# Patient Record
Sex: Female | Born: 1995
Health system: Southern US, Community
[De-identification: ages and names within clinical notes are randomized; demographics above are authoritative.]

## PROBLEM LIST (undated history)

## (undated) DIAGNOSIS — M94262 Chondromalacia, left knee: Secondary | ICD-10-CM

## (undated) DIAGNOSIS — M25369 Other instability, unspecified knee: Secondary | ICD-10-CM

## (undated) DIAGNOSIS — S83519A Sprain of anterior cruciate ligament of unspecified knee, initial encounter: Secondary | ICD-10-CM

---
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
## 2015-03-23 DIAGNOSIS — M5431 Sciatica, right side: Secondary | ICD-10-CM | POA: Diagnosis not present

## 2015-03-23 DIAGNOSIS — M9903 Segmental and somatic dysfunction of lumbar region: Secondary | ICD-10-CM | POA: Diagnosis not present

## 2015-03-25 DIAGNOSIS — M9903 Segmental and somatic dysfunction of lumbar region: Secondary | ICD-10-CM | POA: Diagnosis not present

## 2015-03-25 DIAGNOSIS — M5431 Sciatica, right side: Secondary | ICD-10-CM | POA: Diagnosis not present

## 2015-03-26 DIAGNOSIS — M5431 Sciatica, right side: Secondary | ICD-10-CM | POA: Diagnosis not present

## 2015-03-26 DIAGNOSIS — M9903 Segmental and somatic dysfunction of lumbar region: Secondary | ICD-10-CM | POA: Diagnosis not present

## 2015-05-29 MED FILL — LARIN FE 1.5-30 TABLET: 1.5-30 | 84 days supply | Qty: 84 | Fill #1

## 2015-09-15 MED FILL — LARIN FE 1.5-30 TABLET: 1.5-30 | 84 days supply | Qty: 84 | Fill #2

## 2015-10-14 DIAGNOSIS — J020 Streptococcal pharyngitis: Secondary | ICD-10-CM | POA: Diagnosis not present

## 2015-10-14 DIAGNOSIS — J029 Acute pharyngitis, unspecified: Secondary | ICD-10-CM | POA: Diagnosis not present

## 2015-10-22 DIAGNOSIS — M5431 Sciatica, right side: Secondary | ICD-10-CM | POA: Diagnosis not present

## 2015-10-22 DIAGNOSIS — M9903 Segmental and somatic dysfunction of lumbar region: Secondary | ICD-10-CM | POA: Diagnosis not present

## 2016-02-18 DIAGNOSIS — M5441 Lumbago with sciatica, right side: Secondary | ICD-10-CM | POA: Diagnosis not present

## 2016-02-18 DIAGNOSIS — G8929 Other chronic pain: Secondary | ICD-10-CM | POA: Diagnosis not present

## 2016-02-18 DIAGNOSIS — F331 Major depressive disorder, recurrent, moderate: Secondary | ICD-10-CM | POA: Diagnosis not present

## 2016-02-18 DIAGNOSIS — Z23 Encounter for immunization: Secondary | ICD-10-CM | POA: Diagnosis not present

## 2016-02-18 MED FILL — BUPROPION HCL XL 150 MG TAB: 150 | 30 days supply | Qty: 60 | Fill #0

## 2016-03-19 DIAGNOSIS — F3342 Major depressive disorder, recurrent, in full remission: Secondary | ICD-10-CM | POA: Diagnosis not present

## 2016-04-01 ENCOUNTER — Telehealth: Payer: 59 | Admitting: Family

## 2016-04-01 DIAGNOSIS — R112 Nausea with vomiting, unspecified: Secondary | ICD-10-CM | POA: Diagnosis not present

## 2016-04-01 NOTE — Progress Notes (Signed)
Based on what you shared with me it looks like you have a serious condition that should be evaluated in a face to face office visit.  NOTE: Even if you have entered your credit card information for this eVisit, you will not be charged.   If you are having a true medical emergency please call 911.  If you need an urgent face to face visit, [facility] has four urgent care centers for your convenience.  If you need care fast and have a high deductible or no insurance consider:   [email or web address]  [phone]  [address], [address] [place], [place] [address] 8 am to 8 pm Monday-Friday 10 am to 4 pm Saturday-Sunday   The following sites will take your  insurance:    . [facility]  [phone] Get Driving Directions Find a Provider at this Location  [address] [place], [place] [address] . 10 am to 8 pm Monday-Friday . 12 pm to 8 pm Saturday-Sunday   . [facility]  [phone] Get Driving Directions Find a Provider at this Location  [address], [address] [place], [place] [address] . 8 am to 8 pm Monday-Friday . 9 am to 6 pm Saturday . 11 am to 6 pm Sunday   . [facility] [place]  [phone] Get Driving Directions  [address] [place], [place] [address] . 8 am to 8 pm Monday-Friday . 8 am to 4 pm Saturday-Sunday   . [facility] (walk-ins welcome, or call for a scheduled time)  [phone]  Get Driving Directions Find a Provider at this Location  [address] [place],  [address] . 8 am to 8:30 pm Monday-Thursday . 8 am to 6 pm Friday . 8 am to 4 pm Saturday-Sunday   Your e-visit answers were reviewed by a board certified advanced clinical practitioner to complete your personal care plan.  Thank you for using e-Visits. 

## 2016-06-06 ENCOUNTER — Encounter (HOSPITAL_COMMUNITY): Payer: Self-pay | Admitting: Emergency Medicine

## 2016-06-06 ENCOUNTER — Emergency Department (HOSPITAL_COMMUNITY)
Admission: EM | Admit: 2016-06-06 | Discharge: 2016-06-06 | Disposition: A | Discharge status: Home / Self Care | Payer: 59 | Attending: Emergency Medicine | Admitting: Emergency Medicine

## 2016-06-06 ENCOUNTER — Emergency Department (HOSPITAL_COMMUNITY): Payer: 59

## 2016-06-06 DIAGNOSIS — N201 Calculus of ureter: Secondary | ICD-10-CM | POA: Diagnosis not present

## 2016-06-06 DIAGNOSIS — R109 Unspecified abdominal pain: Secondary | ICD-10-CM | POA: Diagnosis not present

## 2016-06-06 DIAGNOSIS — R1110 Vomiting, unspecified: Secondary | ICD-10-CM | POA: Diagnosis not present

## 2016-06-06 DIAGNOSIS — R1031 Right lower quadrant pain: Secondary | ICD-10-CM | POA: Diagnosis present

## 2016-06-06 LAB — CBC
HCT: 40.2 % (ref 36.0–46.0)
Hemoglobin: 13.6 g/dL (ref 12.0–15.0)
MCH: 28.8 pg (ref 26.0–34.0)
MCHC: 33.8 g/dL (ref 30.0–36.0)
MCV: 85 fL (ref 78.0–100.0)
Platelets: 354 10*3/uL (ref 150–400)
RBC: 4.73 MIL/uL (ref 3.87–5.11)
RDW: 12.6 % (ref 11.5–15.5)
WBC: 17 10*3/uL — ABNORMAL HIGH (ref 4.0–10.5)

## 2016-06-06 LAB — COMPREHENSIVE METABOLIC PANEL
ALBUMIN: 4.5 g/dL (ref 3.5–5.0)
ALK PHOS: 59 U/L (ref 38–126)
ALT: 20 U/L (ref 14–54)
AST: 23 U/L (ref 15–41)
Anion gap: 7 (ref 5–15)
BUN: 11 mg/dL (ref 6–20)
CALCIUM: 9.8 mg/dL (ref 8.9–10.3)
CHLORIDE: 110 mmol/L (ref 101–111)
CO2: 23 mmol/L (ref 22–32)
Creatinine, Ser: 0.81 mg/dL (ref 0.44–1.00)
GFR calc non Af Amer: >60 mL/min (ref >60)
GLUCOSE: 145 mg/dL — AB (ref 65–99)
Potassium: 3.2 mmol/L — ABNORMAL LOW (ref 3.5–5.1)
SODIUM: 140 mmol/L (ref 135–145)
Total Bilirubin: 0.4 mg/dL (ref 0.3–1.2)
Total Protein: 8.4 g/dL — ABNORMAL HIGH (ref 6.5–8.1)

## 2016-06-06 LAB — URINALYSIS, ROUTINE W REFLEX MICROSCOPIC
BACTERIA UA: NONE SEEN
Bilirubin Urine: NEGATIVE
Glucose, UA: NEGATIVE mg/dL
Ketones, ur: NEGATIVE mg/dL
Leukocytes, UA: NEGATIVE
Nitrite: NEGATIVE
PH: 7 (ref 5.0–8.0)
Protein, ur: NEGATIVE mg/dL
Specific Gravity, Urine: 1.016 (ref 1.005–1.030)

## 2016-06-06 LAB — I-STAT BETA HCG BLOOD, ED (MC, WL, AP ONLY): I-stat hCG, quantitative: <5 m[IU]/mL (ref <5)

## 2016-06-06 LAB — LIPASE, BLOOD: LIPASE: 22 U/L (ref 11–51)

## 2016-06-06 MED ORDER — ONDANSETRON HCL 4 MG/2ML IJ SOLN
4.0000 mg | Freq: Once | INTRAMUSCULAR | Status: AC
  Administered 2016-06-06: 4 mg via INTRAVENOUS
  Filled 2016-06-06: qty 2

## 2016-06-06 MED ORDER — OXYCODONE-ACETAMINOPHEN 5-325 MG PO TABS: 1.0000 | ORAL_TABLET | ORAL | 0 refills | Status: DC | PRN

## 2016-06-06 MED ORDER — MORPHINE SULFATE (PF) 4 MG/ML IV SOLN
4.0000 mg | Freq: Once | INTRAVENOUS | Status: AC
  Administered 2016-06-06: 4 mg via INTRAVENOUS
  Filled 2016-06-06: qty 1

## 2016-06-06 MED ORDER — OXYCODONE-ACETAMINOPHEN 5-325 MG PO TABS
1.0000 | ORAL_TABLET | Freq: Once | ORAL | Status: AC
  Administered 2016-06-06: 1 via ORAL
  Filled 2016-06-06: qty 1

## 2016-06-06 MED ORDER — PROMETHAZINE HCL 25 MG/ML IJ SOLN
25.0000 mg | Freq: Once | INTRAMUSCULAR | Status: AC
  Administered 2016-06-06: 25 mg via INTRAVENOUS
  Filled 2016-06-06: qty 1

## 2016-06-06 MED ORDER — KETOROLAC TROMETHAMINE 15 MG/ML IJ SOLN
30.0000 mg | Freq: Once | INTRAMUSCULAR | Status: DC
  Filled 2016-06-06: qty 2

## 2016-06-06 MED ORDER — ONDANSETRON 4 MG PO TBDP: 4.0000 mg | ORAL_TABLET | Freq: Three times a day (TID) | ORAL | 0 refills | Status: DC | PRN

## 2016-06-06 MED ORDER — ONDANSETRON 4 MG PO TBDP
4.0000 mg | ORAL_TABLET | Freq: Once | ORAL | Status: AC | PRN
  Administered 2016-06-06: 4 mg via ORAL
  Filled 2016-06-06: qty 1

## 2016-06-06 MED ORDER — IOPAMIDOL (ISOVUE-300) INJECTION 61%
INTRAVENOUS | Status: AC
  Administered 2016-06-06: 100 mL
  Filled 2016-06-06: qty 100

## 2016-06-06 NOTE — Discharge Instructions (Signed)
Please read attached information. If you experience any new or worsening signs or symptoms please return to the emergency room for evaluation. Please follow-up with your primary care provider or specialist as discussed. Please use medication prescribed only as directed and discontinue taking if you have any concerning signs or symptoms.   °

## 2016-06-06 NOTE — ED Triage Notes (Signed)
Pt reports RLQ pain and emesis that began at 1045 this am. One episode diarrhea as well. LMP 3 weeks ago.

## 2016-06-06 NOTE — ED Provider Notes (Signed)
[facility]-EMERGENCY DEPT Provider Note   CSN: [id number] Arrival date & time: 06/06/16  1249  By signing my name below, I, Sonum Patel, attest that this documentation has been prepared under the direction and in the presence of [employer], PA-C. Electronically Signed: Leone Payor, Scribe. 06/06/16. 4:33 PM.  History   Chief Complaint Chief Complaint  Patient presents with  . Abdominal Pain   The history is provided by the patient. No language interpreter was used.     HPI Comments: Wendy Bridges is a 21 y.o. female who presents to the Emergency Department complaining of constant, unchanged RLQ abdominal pain that began 5.5 hours ago. She reports associated nausea and episodes of vomiting along with 1 episode of diarrhea streaked with bright red blood. She denies similar symptoms in the past. She has not tried any OTC medications for her symptoms. She denies fever, urinary symptoms. She was given Zofran while in the waiting room which provided some relief.   Past Medical History:  Diagnosis Date  . ACL tear 10/2012   left  . 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 Anterior cruciate ligament tear 11/20/2012    Class: Acute    Past Surgical History:  Procedure Laterality Date  . ANTERIOR CRUCIATE LIGAMENT REPAIR Left 11/20/2012   Procedure: left knee arthroscopy with anterior cruciate ligamnet reconstruction autograph partial lateral menisectomy;  Surgeon: Velna Ochs, MD;  Location: [facility];  Service: Orthopedics;  Laterality: Left;    OB History    No data available       Home Medications    Prior to Admission medications   Medication Sig Start Date End Date Taking? Authorizing Provider  clindamycin (CLEOCIN) 300 MG capsule Take 1 capsule (300 mg total) by mouth 3 (three) times daily. X 10 days 03/16/14   Ria Clock, PA    HYDROcodone-acetaminophen (NORCO) 5-325 MG per tablet Take 1 tablet by mouth every 6 (six) hours as needed for pain. 11/20/12   Lindwood Qua, PA-C  norethindrone-ethinyl estradiol (JUNEL FE,GILDESS FE,LOESTRIN FE) 1-20 MG-MCG tablet Take 1 tablet by mouth daily.    Historical Provider, MD  ondansetron (ZOFRAN ODT) 4 MG disintegrating tablet Take 1 tablet (4 mg total) by mouth every 8 (eight) hours as needed for nausea or vomiting. 06/06/16   Eyvonne Mechanic, PA-C  oxyCODONE-acetaminophen (PERCOCET/ROXICET) 5-325 MG tablet Take 1-2 tablets by mouth every 4 (four) hours as needed for severe pain. 06/06/16   Eyvonne Mechanic, PA-C    Family History Family History  Problem Relation Age of Onset  . Anesthesia problems Mother     post-op N/V  . Anesthesia problems Maternal Grandmother     post-op N/V  . Diabetes Paternal Grandmother     Social History Social History  Substance Use Topics  . Smoking status: Never Smoker  . Smokeless tobacco: Never Used  . Alcohol use No     Allergies   Patient has no known allergies.   Review of Systems Review of Systems  A complete 10 system review of systems was obtained and all systems are negative except as noted in the HPI and PMH.    Physical Exam Updated Vital Signs BP 127/80 (BP Location: Left Arm)   Pulse 83   Resp 20   Ht 5\' 7"  (1.702 m)   Wt 108 kg   LMP 05/16/2016  SpO2 98%   BMI 37.28 kg/m   Physical Exam  Constitutional: She is oriented to person, place, and time. She appears well-developed and well-nourished.  HENT:  Head: Normocephalic and atraumatic.  Cardiovascular: Normal rate.   Pulmonary/Chest: Effort normal.  Abdominal:  Right lower quadrant abdominal pain tenderness to palpation.  Positive Rovsing sign  Neurological: She is alert and oriented to person, place, and time.  Skin: Skin is warm and dry.  Psychiatric: She has a normal mood and affect.  Nursing note and vitals reviewed.    ED Treatments / Results   DIAGNOSTIC STUDIES: Oxygen Saturation is 99% on RA, norma by my interpretation.    COORDINATION OF CARE: 4:33 PM Discussed treatment plan with pt at bedside and pt agreed to plan.   Labs (all labs ordered are listed, but only abnormal results are displayed) Labs Reviewed  COMPREHENSIVE METABOLIC PANEL - Abnormal; Notable for the following:       Result Value   Potassium 3.2 (*)    Glucose, Bld 145 (*)    Total Protein 8.4 (*)    All other components within normal limits  CBC - Abnormal; Notable for the following:    WBC 17.0 (*)    All other components within normal limits  URINALYSIS, ROUTINE W REFLEX MICROSCOPIC - Abnormal; Notable for the following:    Hgb urine dipstick LARGE (*)    Squamous Epithelial / LPF 0-5 (*)    All other components within normal limits  LIPASE, BLOOD  I-STAT BETA HCG BLOOD, ED ([facility], [facility], [facility] ONLY)    EKG  EKG Interpretation None       Radiology Ct Abdomen Pelvis W Contrast  Result Date: 06/06/2016 CLINICAL DATA:  Right abdominal pain, nausea/vomiting EXAM: CT ABDOMEN AND PELVIS WITH CONTRAST TECHNIQUE: Multidetector CT imaging of the abdomen and pelvis was performed using the standard protocol following bolus administration of intravenous contrast. CONTRAST:  ISOVUE-300 IOPAMIDOL (ISOVUE-300) INJECTION 61% COMPARISON:  None. FINDINGS: Motion degraded images. Lower chest: 3 mm subpleural lingular nodule along the left fissure (series 6/ image 31), likely benign. 8 mm subpleural nodule at the left lung base (series 6/image 48), also likely benign given the patient's young age. Hepatobiliary: Liver is within normal limits. Gallbladder is unremarkable. No intrahepatic or extrahepatic ductal dilatation. Pancreas: Within normal limits. Spleen: Within normal limits. Adrenals/Urinary Tract: Adrenal glands are within normal limits. Kidneys are within normal limits.  No hydronephrosis. 2 mm distal right ureteral calculus at/just above the UVJ (coronal image  125). Bladder is within normal limits. Stomach/Bowel: Stomach is within normal limits. No evidence of bowel obstruction. Appendix is motion degraded but grossly unremarkable (series 2/ image 58). No evidence of acute appendicitis. Colon is decompressed. Vascular/Lymphatic: No evidence of abdominal aortic aneurysm. No suspicious abdominopelvic lymphadenopathy. Reproductive: Uterus is within normal limits. Bilateral ovaries are within normal limits. Other: No abdominopelvic ascites. Musculoskeletal: Visualized osseous structures are within normal limits. IMPRESSION: Motion degraded images. 2 mm distal right ureteral calculus at the UVJ.  No hydronephrosis. No evidence of bowel obstruction.  Normal appendix. 8 mm nodule at the lateral left lung base, likely benign given the patient's age. Dedicated follow-up imaging is not required per Fleischner Society guidelines in a patient less than 61 years old. Electronically Signed   By: Charline Bills M.D.   On: 06/06/2016 18:28    Procedures Angiocath insertion Date/Time: 06/06/2016 4:46 PM Performed by: Curlene Dolphin, Ivonne Freeburg Authorized by: Curlene Dolphin, Taniya Dasher  Consent: Verbal consent obtained. Consent given by: patient  Patient understanding: patient states understanding of the procedure being performed Patient consent: the patient's understanding of the procedure matches consent given Procedure consent: procedure consent matches procedure scheduled Relevant documents: relevant documents present and verified Test results: test results available and properly labeled Site marked: the operative site was marked Imaging studies: imaging studies available Required items: required blood products, implants, devices, and special equipment available Patient identity confirmed: verbally with patient  Sedation: Patient sedated: no Comments: 20-gauge Angiocath in the left [facility]    (including critical care time)    Medications Ordered in ED Medications  ondansetron  (ZOFRAN-ODT) disintegrating tablet 4 mg (4 mg Oral Given 06/06/16 1308)  morphine 4 MG/ML injection 4 mg (4 mg Intravenous Given 06/06/16 1652)  ondansetron (ZOFRAN) injection 4 mg (4 mg Intravenous Given 06/06/16 1651)  iopamidol (ISOVUE-300) 61 % injection (100 mLs  Contrast Given 06/06/16 1801)  promethazine (PHENERGAN) injection 25 mg (25 mg Intravenous Given 06/06/16 1843)  oxyCODONE-acetaminophen (PERCOCET/ROXICET) 5-325 MG per tablet 1 tablet (1 tablet Oral Given 06/06/16 1922)     Initial Impression / Assessment and Plan / ED Course  I have reviewed the triage vital signs and the nursing notes.  Pertinent labs & imaging results that were available during my care of the patient were reviewed by me and considered in my medical decision making (see chart for details).      Final Clinical Impressions(s) / ED Diagnoses   Final diagnoses:  Ureterolithiasis    Labs: I-STAT beta-hCG, lipase, CMP, CBC  Imaging: CT abdomen and pelvis  Consults:  Therapeutics: Zofran, morphine, normal saline  Discharge Meds: Percocet, Zofran  Assessment/Plan: 21 year old female presents today with right lower quadrant abdominal pain.  Patient was given morphine and Zofran which seemed to improve her symptoms.  Her CT scan shows a right distal ureteral calculus.  Associated hydronephrosis.  No other acute findings that would attribute to her symptoms.  Patient's laboratory analysis significant for WBC is 17 with no other significant abnormalities.  No urinary tract infection or other infectious etiology.  Patient did have a bowel movement with streaking of bright red blood.  This is likely unrelated to her presentation with no significant intra-abdominal pathology other than the stone.  Patient will be discharged home with pain medication, antinausea medication and urology follow-up.  Patient is instructed to return if she continues to endorse significant pain, has recurrence of bloody stool, or any other  concerning signs or symptoms.  Both the patient and her mother verbalized understanding and agreement to today's plan and had no further questions or concerns at the time discharge    New Prescriptions New Prescriptions   ONDANSETRON (ZOFRAN ODT) 4 MG DISINTEGRATING TABLET    Take 1 tablet (4 mg total) by mouth every 8 (eight) hours as needed for nausea or vomiting.   OXYCODONE-ACETAMINOPHEN (PERCOCET/ROXICET) 5-325 MG TABLET    Take 1-2 tablets by mouth every 4 (four) hours as needed for severe pain.   I personally performed the services described in this documentation, which was scribed in my presence. The recorded information has been reviewed and is accurate. I personally performed the services described in this documentation, which was scribed in my presence. The recorded information has been reviewed and is accurate.    Eyvonne MechanicJeffrey Keyli Duross, PA-C 06/06/16 1936    Doug SouSam Jacubowitz, MD 06/06/16 2325

## 2016-06-07 MED FILL — ONDANSETRON ODT 4 MG TABLET: 4 | 6 days supply | Qty: 20 | Fill #0

## 2016-06-07 MED FILL — OXYCODONE/APAP 5/325 MG TAB: 5-325 | 2 days supply | Qty: 15 | Fill #0

## 2016-08-12 DIAGNOSIS — R21 Rash and other nonspecific skin eruption: Secondary | ICD-10-CM | POA: Diagnosis not present

## 2016-08-12 DIAGNOSIS — L989 Disorder of the skin and subcutaneous tissue, unspecified: Secondary | ICD-10-CM | POA: Diagnosis not present

## 2016-08-12 MED FILL — MUPIROCIN 2% OINTMENT: 2 | 10 days supply | Qty: 22 | Fill #0

## 2016-08-12 MED FILL — CEPHALEXIN 500 MG CAPSULE: 500 | 10 days supply | Qty: 30 | Fill #0

## 2017-07-23 ENCOUNTER — Emergency Department: Admission: EM | Admit: 2017-07-23 | Discharge: 2017-07-23 | Discharge status: Against Medical Advice | Payer: 59

## 2017-07-23 NOTE — ED Notes (Signed)
Pt called to triage without answer. Checked in restroom and outside,  Not found.

## 2017-07-23 NOTE — ED Notes (Signed)
Pt called to triage without answer.  

## 2017-07-23 NOTE — ED Notes (Signed)
Pt called for triage without answer 

## 2017-11-09 DIAGNOSIS — G43A0 Cyclical vomiting, not intractable: Secondary | ICD-10-CM | POA: Diagnosis not present

## 2017-11-09 MED FILL — PANTOPRAZOLE SOD DR 40 MG T: 40 | 30 days supply | Qty: 30 | Fill #0

## 2017-11-09 MED FILL — ONDANSETRON ODT 8 MG TABLET: 8 | 10 days supply | Qty: 20 | Fill #0

## 2017-11-10 ENCOUNTER — Emergency Department (HOSPITAL_BASED_OUTPATIENT_CLINIC_OR_DEPARTMENT_OTHER)
Admission: EM | Admit: 2017-11-10 | Discharge: 2017-11-10 | Disposition: A | Discharge status: Home / Self Care | Payer: 59 | Attending: Emergency Medicine | Admitting: Emergency Medicine

## 2017-11-10 ENCOUNTER — Other Ambulatory Visit: Payer: Self-pay

## 2017-11-10 ENCOUNTER — Encounter (HOSPITAL_BASED_OUTPATIENT_CLINIC_OR_DEPARTMENT_OTHER): Payer: Self-pay | Admitting: *Deleted

## 2017-11-10 DIAGNOSIS — G43A0 Cyclical vomiting, not intractable: Secondary | ICD-10-CM | POA: Diagnosis not present

## 2017-11-10 DIAGNOSIS — Z79899 Other long term (current) drug therapy: Secondary | ICD-10-CM | POA: Insufficient documentation

## 2017-11-10 DIAGNOSIS — R1115 Cyclical vomiting syndrome unrelated to migraine: Secondary | ICD-10-CM

## 2017-11-10 DIAGNOSIS — G43A1 Cyclical vomiting, intractable: Secondary | ICD-10-CM | POA: Diagnosis not present

## 2017-11-10 DIAGNOSIS — E875 Hyperkalemia: Secondary | ICD-10-CM | POA: Diagnosis not present

## 2017-11-10 DIAGNOSIS — F1290 Cannabis use, unspecified, uncomplicated: Secondary | ICD-10-CM | POA: Insufficient documentation

## 2017-11-10 DIAGNOSIS — R1110 Vomiting, unspecified: Secondary | ICD-10-CM | POA: Diagnosis present

## 2017-11-10 LAB — CBC
HEMATOCRIT: 40.7 % (ref 36.0–46.0)
HEMOGLOBIN: 14.1 g/dL (ref 12.0–15.0)
MCH: 29.9 pg (ref 26.0–34.0)
MCHC: 34.6 g/dL (ref 30.0–36.0)
MCV: 86.2 fL (ref 78.0–100.0)
Platelets: 324 10*3/uL (ref 150–400)
RBC: 4.72 MIL/uL (ref 3.87–5.11)
RDW: 12.6 % (ref 11.5–15.5)
WBC: 16.7 10*3/uL — ABNORMAL HIGH (ref 4.0–10.5)

## 2017-11-10 LAB — URINALYSIS, ROUTINE W REFLEX MICROSCOPIC
BILIRUBIN URINE: NEGATIVE
Glucose, UA: NEGATIVE mg/dL
HGB URINE DIPSTICK: NEGATIVE
Ketones, ur: 15 mg/dL — AB
Leukocytes, UA: NEGATIVE
Nitrite: NEGATIVE
PH: 8.5 — AB (ref 5.0–8.0)
Protein, ur: NEGATIVE mg/dL
SPECIFIC GRAVITY, URINE: 1.015 (ref 1.005–1.030)

## 2017-11-10 LAB — COMPREHENSIVE METABOLIC PANEL
ALBUMIN: 4.7 g/dL (ref 3.5–5.0)
ALK PHOS: 57 U/L (ref 38–126)
ALT: 23 U/L (ref 0–44)
ANION GAP: 13 (ref 5–15)
AST: 33 U/L (ref 15–41)
BUN: 7 mg/dL (ref 6–20)
CO2: 21 mmol/L — ABNORMAL LOW (ref 22–32)
Calcium: 9.4 mg/dL (ref 8.9–10.3)
Chloride: 105 mmol/L (ref 98–111)
Creatinine, Ser: 0.76 mg/dL (ref 0.44–1.00)
GFR calc non Af Amer: >60 mL/min (ref >60)
GLUCOSE: 135 mg/dL — AB (ref 70–99)
Potassium: 3.4 mmol/L — ABNORMAL LOW (ref 3.5–5.1)
Sodium: 139 mmol/L (ref 135–145)
Total Bilirubin: 0.6 mg/dL (ref 0.3–1.2)
Total Protein: 8.5 g/dL — ABNORMAL HIGH (ref 6.5–8.1)

## 2017-11-10 LAB — LIPASE, BLOOD: LIPASE: 30 U/L (ref 11–51)

## 2017-11-10 LAB — RAPID URINE DRUG SCREEN, HOSP PERFORMED
AMPHETAMINES: NOT DETECTED
BENZODIAZEPINES: NOT DETECTED
Barbiturates: NOT DETECTED
Cocaine: NOT DETECTED
Opiates: NOT DETECTED
TETRAHYDROCANNABINOL: POSITIVE — AB

## 2017-11-10 LAB — PREGNANCY, URINE: PREG TEST UR: NEGATIVE

## 2017-11-10 MED ORDER — HALOPERIDOL LACTATE 5 MG/ML IJ SOLN
2.0000 mg | Freq: Once | INTRAMUSCULAR | Status: AC
  Administered 2017-11-10: 2 mg via INTRAVENOUS
  Filled 2017-11-10: qty 1

## 2017-11-10 MED ORDER — PROMETHAZINE HCL 25 MG PO TABS: 25.0000 mg | ORAL_TABLET | Freq: Four times a day (QID) | ORAL | 0 refills | Status: DC | PRN

## 2017-11-10 MED ORDER — ONDANSETRON HCL 4 MG/2ML IJ SOLN: 4.0000 mg | Freq: Once | INTRAMUSCULAR | Status: DC

## 2017-11-10 MED ORDER — SODIUM CHLORIDE 0.9 % IV BOLUS
1000.0000 mL | Freq: Once | INTRAVENOUS | Status: AC
  Administered 2017-11-10: 1000 mL via INTRAVENOUS

## 2017-11-10 MED ORDER — ONDANSETRON HCL 4 MG/2ML IJ SOLN
4.0000 mg | Freq: Once | INTRAMUSCULAR | Status: AC
  Administered 2017-11-10: 4 mg via INTRAVENOUS

## 2017-11-10 MED ORDER — ONDANSETRON HCL 4 MG/2ML IJ SOLN
INTRAMUSCULAR | Status: AC
  Filled 2017-11-10: qty 2

## 2017-11-10 NOTE — Discharge Instructions (Addendum)
Please avoid any use of marijuana and read the information below carefully.  Continue frequent small sips (10-20 ml) of clear liquids every 5-10 minutes. Gatorade or powerade are good options. Avoid milk, orange juice, and grape juice for now. . Once you have not had further vomiting with the small sips for 4 hours, you may begin to drink larger volumes of fluids at a time and try a bland diet which may include saltine crackers, applesauce, breads, pastas, bananas, bland chicken. If you continues to vomit despite medication, return to the ED for repeat evaluation. Otherwise, follow up with your doctor in 2-3 days for a re-check.  What is cannabinoid hyperemesis syndrome? Cannabinoid hyperemesis syndrome (CHS) is a condition that leads to repeated and severe bouts of vomiting. It occurs in long-term users of marijuana (cannabis). Usually marijuana and other cannabinoids decrease nausea and vomiting, but in some high frequency long-term users, marijuana has the opposite (paradoxical)effect.  Marijuana is the dried leaves, flowers, stems, and seeds from the Cannabis sativa plant. Hashish is made from the concentrated resins of the Cannabis satvia's female flower. Marijuana has several active substances. These include tetrahydrocannabinol (THC) and related chemicals. These bind to molecules found in the brain. That causes the drug high and other effects that users feel.  Your digestive tract also has a number of molecules that bind to [facility] and related substances. So marijuana also affects the digestive tract. For example, the drug can alter the time it takes the stomach to empty. It also affects the esophageal sphincter. Thats the tight band of muscle that opens and closes to let food from the esophagus into the stomach. Long-term marijuana use can change the way the affected molecules respond and lead to the symptoms of CHS.  Marijuana is the most widely used illegal drug in the [place] Young adults are the  most frequent users. A small portion of these people develop CHS. It usually only happens in people who have regularly used marijuana for several years. Often CHS affects those who use the drug at least once a day.  What causes cannabinoid hyperemesis syndrome? Marijuana has very complex effects on the body. Experts are still trying to learn exactly how it causes CHS in some people.  In the brain, marijuana often has the opposite effect of CHS. It helps prevent nausea and vomiting. The drug is also good at stopping such symptoms in people having chemotherapy.  In the digestive tract, marijuana seems to have the opposite effect. It actually makes you more likely to have nausea and vomiting. With first use, the signals from the brain may be more important. That may lead to anti-nausea effects at first. But with repeated use of marijuana, certain receptors in the brain may stop responding to the drug in the same way. That may cause the repeated bouts of vomiting found in people with CHS.  It still isnt clear why some marijuana users get the syndrome. But others do not.  What are the symptoms of cannabinoid hyperemesis syndrome? People with CHS suffer from repeated bouts of vomiting. In between these episodes are times without any symptoms.  There may be months to years in between bouts of hyperemesis (severe vomiting).  The hyperemetic phase is next. Symptoms during this time may include:  Ongoing nausea Repeated episodes of vomiting +/-Abdominal pain Decreased food intake and weight loss Symptoms of dehydration During this phase, vomiting is often intense and overwhelming. Many people take a lot of hot showers during the day. They find  that doing so eases their nausea. (That may be because of the effects of the hot temperature on a part of the brain called the hypothalamus. It has effects on both temperature regulation and vomiting.) People often first seek medical care during this phase.  The  hyperemetic phase may continue until the person completely stops using marijuana. Then the recovery phrase starts. During this time, symptoms go away. Normal eating is possible again. This phase can last days to months. Symptoms usually come back if the person tries marijuana again.  How is cannabinoid hyperemesis syndrome diagnosed? Many health problems can cause repeated vomiting. To make a diagnosis, your healthcare provider will ask you about your symptoms and your past health. He or she will also do a physical exam, including an exam of your abdomen.  Your healthcare provider may also need more tests to rule out other causes of the vomiting. Thats especially the case for ones that may signal a health emergency. Based on your other symptoms, these tests might include:  Blood tests for anemia and infection Tests for electrolytes Tests for pancreas and liver enzymes, to check these organs Pregnancy test Urine analysis, to test for infection or other urinary causes Drug screen, to test for drug-related causes of vomiting X-rays of the abdomen, to check for conditions like blockage Upper endoscopy, to view the stomach and esophagus for possible causes of vomiting Head CT scan, if a nervous system cause of vomiting seems likely Abdominal CT scan, to check for health problems that might need surgery CHS was only recently discovered. So some healthcare providers may not know about it. As a result, they may fail to spot it for many years. They often confuse CHS with cyclical vomiting disorder. Its a health problem that causes similar symptoms. A gastroenterologist might make the diagnosis.  How is cannabinoid hyperemesis syndrome treated? If you have had severe vomiting, you might need to stay in the hospital for a short time. During the hyperemesis phase, you might need these treatments:  Fluid replacement for dehydration, given through an IV Medicines to help decrease vomiting Pain  medicine Proton-pump inhibitors, to treat stomach inflammation Frequent hot showers Symptoms often ease after a day or two unless marijuana is used before this time.  To fully recover, you need to stop using marijuana all together. Some people may get help from drug rehabilitation programs to help them quit. Cognitive behavioral therapy or family therapy can also help. If you stop using marijuana, your symptoms should not come back.  What are the complications of cannabinoid hyperemesis syndrome? Very severe, prolonged vomiting may lead to dehydration. It may also lead to electrolyte problems in your blood. If untreated, these can cause rare complications such as:  Brain swelling (cerebral edema) Muscle spasms or weakness Seizures Kidney failure Heart rhythm abnormalities Shock Your healthcare team will quickly work to fix any dehydration or electrolyte problems. Doing so can help prevent these problems.  What can I do to prevent cannabinoid hyperemesis syndrome? You can prevent CHS by not using marijuana in any form. You may be reluctant to believe that marijuana might be the underlying cause of your symptoms. That may be because you have used it for many years without having any problems. The syndrome may take several years to develop. The drug may help prevent nausea in new users who dont use it often. But people with CHS need to completely quit using it. If they dont, their symptoms will likely come back.  Quitting marijuana may  lead to other health benefits, like:  Better lung function Improved memory and thinking skills Better sleep Decreased risk for depression and anxiety When should I call my healthcare provider? Call your healthcare provider if you have had severe vomiting for a day or more. Key points about cannabinoid hyperemesis syndrome CHS is a condition that leads to repeated and severe bouts of vomiting. It results from long-term use of marijuana. Most people  self-treat using hot showers to help reduce their symptoms. Some people with the syndrome may not be diagnosed for several years. Admitting to your healthcare provider that you use marijuana daily can significantly speed up the diagnosis and possibly avoid many invasive and expensive tests. You might need to stay in the hospital to treat dehydration from [facility]. Symptoms start to go away within a day or two after stopping marijuana use. In people with CHS, symptoms almost always come back if they use the drug again. Next steps Tips to help you get the most from a visit to your healthcare provider:  Know the reason for your visit and what you want to happen. Before your visit, write down questions you want answered. Bring someone with you to help you ask questions and remember what your provider tells you. At the visit, write down the name of a new diagnosis, and any new medicines, treatments, or tests. Also write down any new instructions your provider gives you. Know why a new medicine or treatment is prescribed, and how it will help you. Also know what the side effects are. Ask if your condition can be treated in other ways. Know why a test or procedure is recommended and what the results could mean. Know what to expect if you do not take the medicine or have the test or procedure. If you have a follow-up appointment, write down the date, time, and purpose for that visit. Know how you can contact your provider if you have questions.

## 2017-11-10 NOTE — ED Notes (Signed)
Pt unable to void at this time. 

## 2017-11-10 NOTE — ED Provider Notes (Signed)
[facility] EMERGENCY DEPARTMENT Provider Note   CSN: [id number] Arrival date & time: 11/10/17  1429     History   Chief Complaint Chief Complaint  Patient presents with  . Emesis    HPI Wendy Bridges is a 22 y.o. female.  The emergency department with chief complaint of intractable vomiting.  The patient has had intractable vomiting for the past 4 days.  She states that every day she wakes up with vomiting that lasts at least until noon.  She was seen by her PCP yesterday and had lab work done was given Zofran and acid reducing medication.  She states that she has been vomiting so much she could never keep the dissolving tablet in her mouth long enough to have at work and they called her PCP today who directed her to the emergency department.  The patient states that she does not currently smoke marijuana and has been several years.  She has had one episode like this in the past back in March and both of these episodes have been associated with menstruation.  Patient states that she has very irregular periods she has not had been worked up in the past for this.  She denies fevers, chills, abdominal pain except for abdominal muscle wall pain when she retches.  She denies constipation or diarrhea. She states that she finds relief by taking warm showers.  HPI  Past Medical History:  Diagnosis Date  . ACL tear 10/2012   left  . 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 Anterior cruciate ligament tear 11/20/2012    Class: Acute    Past Surgical History:  Procedure Laterality Date  . ANTERIOR CRUCIATE LIGAMENT REPAIR Left 11/20/2012   Procedure: left knee arthroscopy with anterior cruciate ligamnet reconstruction autograph partial lateral menisectomy;  Surgeon: Velna Ochs, MD;  Location: [facility];  Service: Orthopedics;  Laterality: Left;     OB History     None      Home Medications    Prior to Admission medications   Medication Sig Start Date End Date Taking? Authorizing Provider  clindamycin (CLEOCIN) 300 MG capsule Take 1 capsule (300 mg total) by mouth 3 (three) times daily. X 10 days 03/16/14   Ria Clock, PA  HYDROcodone-acetaminophen (NORCO) 5-325 MG per tablet Take 1 tablet by mouth every 6 (six) hours as needed for pain. 11/20/12   Lindwood Qua, PA-C  norethindrone-ethinyl estradiol (JUNEL FE,GILDESS FE,LOESTRIN FE) 1-20 MG-MCG tablet Take 1 tablet by mouth daily.    [provider]  ondansetron (ZOFRAN ODT) 4 MG disintegrating tablet Take 1 tablet (4 mg total) by mouth every 8 (eight) hours as needed for nausea or vomiting. 06/06/16   Hedges, Tinnie Gens, PA-C  oxyCODONE-acetaminophen (PERCOCET/ROXICET) 5-325 MG tablet Take 1-2 tablets by mouth every 4 (four) hours as needed for severe pain. 06/06/16   Eyvonne Mechanic, PA-C    Family History Family History  Problem Relation Age of Onset  . Anesthesia problems Mother        post-op N/V  . Anesthesia problems Maternal Grandmother        post-op N/V  . Diabetes Paternal Grandmother     Social History Social History   Tobacco Use  . Smoking status: Never Smoker  . Smokeless tobacco: Never Used  Substance Use Topics  . Alcohol use: No  .  Drug use: No     Allergies   Patient has no known allergies.   Review of Systems Review of Systems  Ten systems reviewed and are negative for acute change, except as noted in the HPI.   Physical Exam Updated Vital Signs BP 131/80   Pulse (!) 54   Resp 16   Ht 5\' 7"  (1.702 m)   Wt 121.1 kg   LMP 11/05/2017   SpO2 100%   BMI 41.82 kg/m   Physical Exam  Constitutional: She is oriented to person, place, and time. She appears well-developed.  Peaked, ill-appearing female  HENT:  Head: Normocephalic and atraumatic.  Eyes: Pupils are equal, round, and reactive to light. EOM are normal.  Neck: Normal  range of motion. Neck supple.  Cardiovascular: Normal rate and regular rhythm.  Pulmonary/Chest: Effort normal and breath sounds normal.  Abdominal: Soft. Bowel sounds are normal. She exhibits no distension. There is no tenderness.  Musculoskeletal: She exhibits no edema or tenderness.  Neurological: She is alert and oriented to person, place, and time.  Skin: Skin is warm and dry.  Psychiatric: She has a normal mood and affect.  Nursing note and vitals reviewed.    ED Treatments / Results  Labs (all labs ordered are listed, but only abnormal results are displayed) Labs Reviewed  COMPREHENSIVE METABOLIC PANEL - Abnormal; Notable for the following components:      Result Value   Potassium 3.4 (*)    CO2 21 (*)    Glucose, Bld 135 (*)    Total Protein 8.5 (*)    All other components within normal limits  CBC - Abnormal; Notable for the following components:   WBC 16.7 (*)    All other components within normal limits  LIPASE, BLOOD  URINALYSIS, ROUTINE W REFLEX MICROSCOPIC  PREGNANCY, URINE  RAPID URINE DRUG SCREEN, HOSP PERFORMED    EKG None  Radiology No results found.  Procedures Procedures (including critical care time)  Medications Ordered in ED Medications  sodium chloride 0.9 % bolus 1,000 mL (1,000 mLs Intravenous New Bag/Given 11/10/17 1512)  ondansetron (ZOFRAN) injection 4 mg (4 mg Intravenous Given 11/10/17 1515)  haloperidol lactate (HALDOL) injection 2 mg (2 mg Intravenous Given 11/10/17 1613)     Initial Impression / Assessment and Plan / ED Course  I have reviewed the triage vital signs and the nursing notes.  Pertinent labs & imaging results that were available during my care of the patient were reviewed by me and considered in my medical decision making (see chart for details).  Clinical Course as of Nov 10 2008  Fri Nov 10, 2017  1633 This is a 22 year old female with intractable vomiting. The emergent differential diagnosis for vomiting includes,  but is not limited to ACS/MI, Boerhaave's, DKA, Intracranial Hemorrhage, Ischemic bowel, Meningitis, Sepsis, Acute radiation syndrome, Acute gastric dilation, Acetaminophen toxicity, Adrenal insufficiency, Appendicitis, Aspirin toxicity, Bowel obstruction/ileus, Carbon monoxide poisoning, Cholecystitis, CNS tumor. Digoxin toxicity, Electrolyte abnormalities, Elevated ICP, Gastric outlet obstruction, Hyperemesis gravidarum, Pancreatitis, Peritonitis, Ruptured viscus, Testicular torsion/ovarian torsion, Theophyline toxicity, Biliary colic, Cannabinoid hyperemesis syndrome, Chemotherapy, Disulfiram effect, Erythromycin, ETOH, Gastritis, Gastroenteritis, Gastroparesis, Hepatitis, Ibuprofen, Ipecac toxicity, Labyrinthitis, Migraine, Motion sickness, Narcotic withdrawal, Thyroid, Pregnancy, Peptic ulcer disease, Renal colic, and UTI    [AH]  1645 Her  labs show mild hyperkalemia, elevated blood glucose and elevated white blood cell count which I suspect is likely acute phase reaction given her benign abdominal exam.  Patient's pulse rate is low and I also suspect  that she is vagal laying given her protracted vomiting.   [AH]  1712 Pulse Rate(!): 46 [AH]    Clinical Course User Index [AH] Arthor Captain, PA-C    Patient here with vomiting.  Given Zofran and and Haldol which resolved her nausea and vomiting.  I reviewed her EKG which normal sinus rhythm and no evidence of QT prolongation.  The patient denies marijuana abuse however her urine drug screen is positive for THC.  I did discuss the fact that her symptoms could very likely be related to cannabis hyperemesis also given her history of liking to take warm showers which seems to give her some relief.  The patient has the potential for other issues given the association with her menstrual cycle and should certainly follow-up with OB/GYN for further evaluation of potential endometriosis or PCOS.  Patient is able to tolerate fluids at this time.  She will  be discharged with Phenergan tablets.  She appears appropriate for discharge and I discussed return precautions with the patient and her family who is at bedside.  Final Clinical Impressions(s) / ED Diagnoses   Final diagnoses:  None    ED Discharge Orders    None       Arthor Captain, PA-C 11/10/17 2012    Gwyneth Sprout, MD 11/10/17 2306

## 2017-11-10 NOTE — ED Notes (Signed)
Sprite given for PO challenge. 

## 2017-11-10 NOTE — ED Triage Notes (Signed)
Vomiting every am for a week. Today she has been vomiting all day. She has zofran and antiacid with no relief.

## 2017-11-21 ENCOUNTER — Other Ambulatory Visit: Payer: Self-pay | Admitting: Family Medicine

## 2017-11-21 DIAGNOSIS — R1032 Left lower quadrant pain: Secondary | ICD-10-CM

## 2017-11-21 DIAGNOSIS — R112 Nausea with vomiting, unspecified: Secondary | ICD-10-CM

## 2017-11-21 DIAGNOSIS — R3129 Other microscopic hematuria: Secondary | ICD-10-CM

## 2017-11-24 ENCOUNTER — Ambulatory Visit
Admission: RE | Admit: 2017-11-24 | Discharge: 2017-11-24 | Disposition: A | Discharge status: Home / Self Care | Payer: 59 | Source: Ambulatory Visit | Attending: Family Medicine | Admitting: Family Medicine

## 2017-11-24 DIAGNOSIS — R112 Nausea with vomiting, unspecified: Secondary | ICD-10-CM

## 2017-11-24 DIAGNOSIS — R3129 Other microscopic hematuria: Secondary | ICD-10-CM

## 2017-11-24 DIAGNOSIS — R102 Pelvic and perineal pain: Secondary | ICD-10-CM | POA: Diagnosis not present

## 2017-11-24 DIAGNOSIS — R1032 Left lower quadrant pain: Secondary | ICD-10-CM

## 2018-01-01 DIAGNOSIS — N915 Oligomenorrhea, unspecified: Secondary | ICD-10-CM | POA: Diagnosis not present

## 2018-01-01 DIAGNOSIS — Z0000 Encounter for general adult medical examination without abnormal findings: Secondary | ICD-10-CM | POA: Diagnosis not present

## 2018-01-01 DIAGNOSIS — Z113 Encounter for screening for infections with a predominantly sexual mode of transmission: Secondary | ICD-10-CM | POA: Diagnosis not present

## 2018-01-01 DIAGNOSIS — L680 Hirsutism: Secondary | ICD-10-CM | POA: Diagnosis not present

## 2018-01-02 MED FILL — FEMYNOR 0.25-35 MG-MCG TABS: 0.25-35 | 28 days supply | Qty: 28 | Fill #0

## 2018-01-09 DIAGNOSIS — Z23 Encounter for immunization: Secondary | ICD-10-CM | POA: Diagnosis not present

## 2018-01-09 DIAGNOSIS — F331 Major depressive disorder, recurrent, moderate: Secondary | ICD-10-CM | POA: Diagnosis not present

## 2018-01-10 MED FILL — clonazePAM 0.5 MG TABS: 0.5 | 15 days supply | Qty: 15 | Fill #0

## 2018-01-10 MED FILL — ESCITALOPRAM 10 MG TABLET: 10 | 30 days supply | Qty: 30 | Fill #0

## 2018-01-24 DIAGNOSIS — R1115 Cyclical vomiting syndrome unrelated to migraine: Secondary | ICD-10-CM | POA: Diagnosis not present

## 2018-01-24 DIAGNOSIS — R110 Nausea: Secondary | ICD-10-CM | POA: Diagnosis not present

## 2018-01-25 ENCOUNTER — Other Ambulatory Visit: Payer: Self-pay | Admitting: Gastroenterology

## 2018-01-25 DIAGNOSIS — R112 Nausea with vomiting, unspecified: Secondary | ICD-10-CM

## 2018-01-25 DIAGNOSIS — R194 Change in bowel habit: Secondary | ICD-10-CM | POA: Diagnosis not present

## 2018-01-25 DIAGNOSIS — K219 Gastro-esophageal reflux disease without esophagitis: Secondary | ICD-10-CM | POA: Diagnosis not present

## 2018-01-25 MED FILL — SUMAtriptan 5 MG/ACT SOLN: 5 | 30 days supply | Qty: 6 | Fill #0

## 2018-01-26 ENCOUNTER — Ambulatory Visit (HOSPITAL_COMMUNITY)
Admission: RE | Admit: 2018-01-26 | Discharge: 2018-01-26 | Disposition: A | Discharge status: Home / Self Care | Payer: 59 | Source: Ambulatory Visit | Attending: Gastroenterology | Admitting: Gastroenterology

## 2018-01-26 DIAGNOSIS — R112 Nausea with vomiting, unspecified: Secondary | ICD-10-CM | POA: Insufficient documentation

## 2018-02-02 MED FILL — FEMYNOR 0.25-35 MG-MCG TABS: 0.25-35 | 28 days supply | Qty: 28 | Fill #1

## 2018-02-05 ENCOUNTER — Other Ambulatory Visit (HOSPITAL_COMMUNITY): Payer: 59

## 2018-02-05 ENCOUNTER — Encounter (HOSPITAL_COMMUNITY)
Admission: RE | Admit: 2018-02-05 | Discharge: 2018-02-05 | Disposition: A | Discharge status: Home / Self Care | Payer: 59 | Source: Ambulatory Visit | Attending: Gastroenterology | Admitting: Gastroenterology

## 2018-02-05 DIAGNOSIS — R112 Nausea with vomiting, unspecified: Secondary | ICD-10-CM | POA: Diagnosis not present

## 2018-02-05 MED ORDER — TECHNETIUM TC 99M MEBROFENIN IV KIT
5.0000 | PACK | Freq: Once | INTRAVENOUS | Status: AC | PRN
  Administered 2018-02-05: 5 via INTRAVENOUS

## 2018-02-20 ENCOUNTER — Other Ambulatory Visit: Payer: Self-pay | Admitting: Surgery

## 2018-02-20 DIAGNOSIS — R1110 Vomiting, unspecified: Secondary | ICD-10-CM | POA: Diagnosis not present

## 2018-03-07 DIAGNOSIS — R112 Nausea with vomiting, unspecified: Secondary | ICD-10-CM | POA: Diagnosis not present

## 2018-03-08 DIAGNOSIS — R112 Nausea with vomiting, unspecified: Secondary | ICD-10-CM | POA: Diagnosis not present

## 2018-03-08 DIAGNOSIS — K2930 Chronic superficial gastritis without bleeding: Secondary | ICD-10-CM | POA: Diagnosis not present

## 2018-03-08 MED FILL — FEMYNOR 0.25-35 MG-MCG TABS: 0.25-35 | 84 days supply | Qty: 84 | Fill #2

## 2018-04-02 DIAGNOSIS — Z3041 Encounter for surveillance of contraceptive pills: Secondary | ICD-10-CM | POA: Diagnosis not present

## 2018-04-02 DIAGNOSIS — E282 Polycystic ovarian syndrome: Secondary | ICD-10-CM | POA: Diagnosis not present

## 2018-04-08 ENCOUNTER — Telehealth: Payer: 59 | Admitting: Family

## 2018-04-08 DIAGNOSIS — J111 Influenza due to unidentified influenza virus with other respiratory manifestations: Secondary | ICD-10-CM

## 2018-04-08 MED ORDER — OSELTAMIVIR PHOSPHATE 75 MG PO CAPS: 75.0000 mg | ORAL_CAPSULE | Freq: Two times a day (BID) | ORAL | 0 refills | Status: DC

## 2018-04-08 NOTE — Progress Notes (Signed)
Thank you for the details you included in the comment boxes. Those details are very helpful in determining the best course of treatment for you and help us to provide the best care.  E visit for Flu like symptoms   We are sorry that you are not feeling well.  Here is how we plan to help! Based on what you have shared with me it looks like you may have possible exposure to a virus that causes influenza.  Influenza or "the flu" is   an infection caused by a respiratory virus. The flu virus is highly contagious and persons who did not receive their yearly flu vaccination may "catch" the flu from close contact.  We have anti-viral medications to treat the viruses that cause this infection. They are not a "cure" and only shorten the course of the infection. These prescriptions are most effective when they are given within the first 2 days of "flu" symptoms. Antiviral medication are indicated if you have a high risk of complications from the flu. You should  also consider an antiviral medication if you are in close contact with someone who is at risk. These medications can help patients avoid complications from the flu  but have side effects that you should know. Possible side effects from Tamiflu or oseltamivir include nausea, vomiting, diarrhea, dizziness, headaches, eye redness, sleep problems or other respiratory symptoms. You should not take Tamiflu if you have an allergy to oseltamivir or any to the ingredients in Tamiflu.  Based upon your symptoms and potential risk factors I have prescribed Oseltamivir (Tamiflu).  It has been sent to your designated pharmacy.  You will take one 75 mg capsule orally twice a day for the next 5 days.  ANYONE WHO HAS FLU SYMPTOMS SHOULD: . Stay home. The flu is highly contagious and going out or to work exposes others! . Be sure to drink plenty of fluids. Water is fine as well as fruit juices, sodas and electrolyte beverages. You may want to stay away from caffeine or  alcohol. If you are nauseated, try taking small sips of liquids. How do you know if you are getting enough fluid? Your urine should be a pale yellow or almost colorless. . Get rest. . Taking a steamy shower or using a humidifier may help nasal congestion and ease sore throat pain. Using a saline nasal spray works much the same way. . Cough drops, hard candies and sore throat lozenges may ease your cough. . Line up a caregiver. Have someone check on you regularly.   GET HELP RIGHT AWAY IF: . You cannot keep down liquids or your medications. . You become short of breath . Your fell like you are going to pass out or loose consciousness. . Your symptoms persist after you have completed your treatment plan MAKE SURE YOU   Understand these instructions.  Will watch your condition.  Will get help right away if you are not doing well or get worse.  Your e-visit answers were reviewed by a board certified advanced clinical practitioner to complete your personal care plan.  Depending on the condition, your plan could have included both over the counter or prescription medications.  If there is a problem please reply  once you have received a response from your provider.  Your safety is important to us.  If you have drug allergies check your prescription carefully.    You can use MyChart to ask questions about today's visit, request a non-urgent call back, or ask   for a work or school excuse for 24 hours related to this e-Visit. If it has been greater than 24 hours you will need to follow up with your provider, or enter a new e-Visit to address those concerns.  You will get an e-mail in the next two days asking about your experience.  I hope that your e-visit has been valuable and will speed your recovery. Thank you for using e-visits.   

## 2018-07-19 DIAGNOSIS — Z20828 Contact with and (suspected) exposure to other viral communicable diseases: Secondary | ICD-10-CM | POA: Diagnosis not present

## 2018-07-19 DIAGNOSIS — R05 Cough: Secondary | ICD-10-CM | POA: Diagnosis not present

## 2018-07-19 DIAGNOSIS — Z7189 Other specified counseling: Secondary | ICD-10-CM | POA: Diagnosis not present

## 2018-07-19 DIAGNOSIS — J069 Acute upper respiratory infection, unspecified: Secondary | ICD-10-CM | POA: Diagnosis not present

## 2018-10-15 ENCOUNTER — Ambulatory Visit (HOSPITAL_COMMUNITY)
Admission: RE | Admit: 2018-10-15 | Discharge: 2018-10-15 | Disposition: A | Discharge status: Home / Self Care | Payer: 59 | Attending: Psychiatry | Admitting: Psychiatry

## 2018-10-15 DIAGNOSIS — F411 Generalized anxiety disorder: Secondary | ICD-10-CM | POA: Insufficient documentation

## 2018-10-15 DIAGNOSIS — Z833 Family history of diabetes mellitus: Secondary | ICD-10-CM | POA: Diagnosis not present

## 2018-10-15 NOTE — BH Assessment (Signed)
Assessment Note  Wendy Bridges is a 23 y.o. [facility] to deal with her anxiety.  Pt states "I'm struggling today at work. I'm experiencing a whole lot of anxiety and some panic attacks.  I need some help with my emotions." Pt reports having a [facility] appointment [facility] for this coming Wednesday. Pt admits to substance use in the following "I smoke 1 blunt a week maybe within 3 days." Pt denies being SI/HI/A/V-hallucinations.     Pt was able to reliably contract for safety. Pt was receptive to receiving additional community outpatient resources to help with her anxiety.   Pt reside with her parents, brother and grandmother.  Pt works a Fish farm managerfull-time job.  Pt denies a history of inpatient/outpatient MH/SA treatment.  Pt denies a history of physical, sexual and verbal abuse.  Patient was wearing casual clothes and appeared appropriately groomed.  Pt was alert throughout the assessment.  Patient made fair eye contact and had normal psychomotor activity.  Patient spoke in a normal voice without pressured speech.  Pt expressed feeling anxious.  Pt's affect appeared dysphoric and congruent with stated mood. Pt's thought process was coherent and logical.  Pt presented with partial insight and judgement.  Pt did not appear to be responding to internal stimuli.  Pt was able to reliably contract for safety.   [facility] with BH Provider, Denzil MagnusonLaShunda Thomas, NP who recommended discharge with community mental health resources.  [facility] provided [facility] with community outpatient MH resources prior to pt's discharge.  Diagnosis: F41.1 Generalized Anxiety Disorder  Past Medical History:  Past Medical History:  Diagnosis Date  . ACL tear 10/2012   left  . 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 ANTERIOR CRUCIATE LIGAMENT REPAIR Left 11/20/2012    Procedure: left knee arthroscopy with anterior cruciate ligamnet reconstruction autograph partial lateral menisectomy;  Surgeon: Velna OchsPeter G Dalldorf, MD;  Location: [facility];  Service: Orthopedics;  Laterality: Left;    Family History:  Family History  Problem Relation Age of Onset  . Anesthesia problems Mother        post-op N/V  . Anesthesia problems Maternal Grandmother        post-op N/V  . Diabetes Paternal Grandmother     Social History:  reports that she has never smoked. She has never used smokeless tobacco. She reports current drug use. Drug: Marijuana. She reports that she does not drink alcohol.  Additional Social History:  Alcohol / Drug Use Pain Medications: See MARs Prescriptions: See MARs Over the Counter: See MARs History of alcohol / drug use?: Yes Substance #1 Name of Substance 1: Cannabis 1 - Age of First Use: unknown 1 - Amount (size/oz): 1 blunt 1 - Frequency: 3 times per week 1 - Duration: ongoing 1 - Last Use / Amount: few days ago  CIWA: CIWA-Ar BP: 126/75 Pulse Rate: 84 COWS:    Allergies: No Known Allergies  Home Medications: (Not in a hospital admission)   OB/GYN Status:  No LMP recorded.  General Assessment Data [facility] TTS Assessment: In system Is this a Tele or Face-to-Face Assessment?: Face-to-Face Is this an Initial Assessment or a Re-assessment for this encounter?: Initial Assessment Patient Accompanied by:: N/A Language Other than English: No Living Arrangements: Other (Comment)(Parents) What gender do you identify as?: Female Marital  status: Single Maiden name: Fitterer Pregnancy Status: Unknown Living Arrangements: Parent, Other relatives(Parents, brother, grandmother) Can pt return to current living arrangement?: Yes Admission Status: Voluntary Is patient capable of signing voluntary admission?: Yes Referral Source: Self/Family/Friend     Crisis Care Plan Living  Arrangements: Parent, Other relatives(Parents, brother, grandmother) Name of Psychiatrist: NA Name of Therapist: Nelwyn Salisbury,  Education Status Is patient currently in school?: No Is the patient employed, unemployed or receiving disability?: Employed  Risk to self with the past 6 months Suicidal Ideation: No Has patient been a risk to self within the past 6 months prior to admission? : No Suicidal Intent: No Has patient had any suicidal intent within the past 6 months prior to admission? : No Is patient at risk for suicide?: No Suicidal Plan?: No Has patient had any suicidal plan within the past 6 months prior to admission? : No Access to Means: No What has been your use of drugs/alcohol within the last 12 months?: Cannabis Previous Attempts/Gestures: No Triggers for Past Attempts: None known Intentional Self Injurious Behavior: None Family Suicide History: No Persecutory voices/beliefs?: No Depression: Yes Depression Symptoms: Tearfulness, Isolating, Fatigue, Guilt, Loss of interest in usual pleasures, Feeling worthless/self pity Substance abuse history and/or treatment for substance abuse?: No Suicide prevention information given to non-admitted patients: Not applicable  Risk to Others within the past 6 months Homicidal Ideation: No Does patient have any lifetime risk of violence toward others beyond the six months prior to admission? : No Thoughts of Harm to Others: No Current Homicidal Intent: No Current Homicidal Plan: No Access to Homicidal Means: No History of harm to others?: No Assessment of Violence: None Noted Does patient have access to weapons?: No Criminal Charges Pending?: No Does patient have a court date: No Is patient on probation?: No  Psychosis Hallucinations: None noted Delusions: None noted  Mental Status Report Appearance/Hygiene: Layered clothes, Poor hygiene Eye Contact: Fair Motor Activity: Restlessness Speech: Logical/coherent Level  of Consciousness: Alert, Restless Mood: Anxious Affect: Anxious, Appropriate to circumstance Anxiety Level: Moderate Thought Processes: Coherent Judgement: Unimpaired Orientation: Person, Place, Appropriate for developmental age Obsessive Compulsive Thoughts/Behaviors: None  Cognitive Functioning Concentration: Normal Memory: Recent Intact, Remote Intact Is patient IDD: No Insight: Fair Impulse Control: Fair Appetite: Fair Have you had any weight changes? : No Change Sleep: No Change Total Hours of Sleep: 5 Vegetative Symptoms: None  ADLScreening [facility] Assessment Services) Patient's cognitive ability adequate to safely complete daily activities?: Yes Patient able to express need for assistance with ADLs?: Yes Independently performs ADLs?: Yes (appropriate for developmental age)  Prior Inpatient Therapy Prior Inpatient Therapy: No  Prior Outpatient Therapy Prior Outpatient Therapy: No Does patient have an ACCT team?: No Does patient have Intensive In-House Services?  : No Does patient have [facility] services? : No Does patient have P4CC services?: No  ADL Screening (condition at time of admission) Patient's cognitive ability adequate to safely complete daily activities?: Yes Is the patient deaf or have difficulty hearing?: No Does the patient have difficulty seeing, even when wearing glasses/contacts?: No Does the patient have difficulty concentrating, remembering, or making decisions?: No Patient able to express need for assistance with ADLs?: Yes Does the patient have difficulty dressing or bathing?: No Independently performs ADLs?: Yes (appropriate for developmental age) Does the patient have difficulty walking or climbing stairs?: No Weakness of Legs: None Weakness of Arms/Hands: None  Home Assistive Devices/Equipment Home Assistive Devices/Equipment: None    Abuse/Neglect Assessment (Assessment to be complete while patient  is alone) Abuse/Neglect Assessment Can  Be Completed: Yes Physical Abuse: Denies Verbal Abuse: Denies Sexual Abuse: Denies Exploitation of patient/patient's resources: Denies Self-Neglect: Denies     Merchant navy officerAdvance Directives (For Healthcare) Does Patient Have a Medical Advance Directive?: No Would patient like information on creating a medical advance directive?: No - Patient declined Nutrition Screen- [facility] Adult/WL/AP Patient's home diet: NPO        [facility] case with BH Provider, Denzil MagnusonLaShunda Thomas, NP who recommended discharge with community mental health resources.  [facility] provided [facility] with community outpatient MH resources prior to pt's discharge.  Disposition Initial Assessment Completed for this Encounter: Yes Disposition of Patient: Discharge(Per Denzil MagnusonLaShunda Thomas, NP) Patient refused recommended treatment: No Mode of transportation if patient is discharged/movement?: Car Patient referred to: Outpatient clinic referral  On Site Evaluation by:  Raistlin Gum L. Ladora Osterberg, MS, [facility], NCC Reviewed with Physician:  Denzil MagnusonLaShunda Thomas, NP  Tyron Russellhristel L Mliss Wedin, MS, [facility], NCC 10/15/2018 3:20 PM

## 2018-10-15 NOTE — H&P (Signed)
Wendy Bridges is an 23 y.o. female who presents to [facility] as a voluntary walk-in accompanied alone. Patient endorses anxiety and depression. She reports she her job has become overwhelming which has caused her to feel more anxious and depressed. She describes depression as hopelessness, worthlessness, decreased energy, social isolation, irritability, and fluctuations in sleep and appetite. Reports feeling depressed for the last 3 years although states her depression has worsened in the past year. Reports she has been on Lexapro in the past but stopped taking it about 6 months ago. She denies SI or HI. When asked if she was hearing or seeing things that's normally not there she stated, " I only here my own voice in my head." She does not appear internally preoccupied. She has no previous inpatient psychiatric hospitalizations. Reports she is to start therapy 10/17/2018. Denies history of trauma. Reports smoking mariajuana, one blunt, over the course of three times per week. She denies other substance abuse or use. Reports racing thoughts and panic attacks. Reports she does not feel like she needs inpatient psychiatric hospitalizations and adds she would like resources for additional outpatient services. She then asks if FMLA forms could be filled out as she is taking a leave of absence  from work.   Total Time spent with patient: 15 minutes  Psychiatric Specialty Exam: Physical Exam  Vitals reviewed. Constitutional: She is oriented to person, place, and time.  Neurological: She is alert and oriented to person, place, and time.    Review of Systems  Psychiatric/Behavioral: Positive for depression. Negative for hallucinations, memory loss, substance abuse and suicidal ideas. The patient is nervous/anxious. The patient does not have insomnia.   All other systems reviewed and are negative.   Blood pressure 126/75, pulse 84, temperature 98 F (36.7 C), temperature  source Oral, resp. rate 18, SpO2 99 %.There is no height or weight on file to calculate BMI.  General Appearance: Casual  Eye Contact:  Good  Speech:  Clear and Coherent and Normal Rate  Volume:  Normal  Mood:  Anxious and Depressed  Affect:  anxious  Thought Process:  Coherent, Linear and Descriptions of Associations: Intact  Orientation:  Full (Time, Place, and Person)  Thought Content:  Logical  Suicidal Thoughts:  No  Homicidal Thoughts:  No  Memory:  Immediate;   Fair Recent;   Fair  Judgement:  Fair  Insight:  Fair  Psychomotor Activity:  Normal  Concentration: Concentration: Fair and Attention Span: Fair  Recall:  [employer] of Knowledge:Fair  Language: Good  Akathisia:  Negative  Handed:  Right  AIMS (if indicated):     Assets:  Communication Skills Desire for Improvement Resilience  Sleep:       Musculoskeletal: Strength & Muscle Tone: within normal limits Gait & Station: normal Patient leans: N/A  Blood pressure 126/75, pulse 84, temperature 98 F (36.7 C), temperature source Oral, resp. rate 18, SpO2 99 %.  Recommendations:  No evidence of imminent risk to self at present.   Patient does not meet criteria for psychiatric inpatient admission. Reccommended to attend upcoming therapy appointment to establish outpatient therapy. Additional outpatient resources were provided by TTS counselor. Mordecai Maes, NP 10/15/2018, 3:28 PM

## 2018-10-16 DIAGNOSIS — R112 Nausea with vomiting, unspecified: Secondary | ICD-10-CM | POA: Diagnosis not present

## 2018-10-16 DIAGNOSIS — F419 Anxiety disorder, unspecified: Secondary | ICD-10-CM | POA: Diagnosis not present

## 2018-10-16 MED FILL — SERTRALINE HCL 50 MG TABS: 50 | 30 days supply | Qty: 30 | Fill #0

## 2018-10-16 MED FILL — hydrOXYzine HCL 25 MG TABS: 25 | 10 days supply | Qty: 30 | Fill #0

## 2018-11-12 DIAGNOSIS — R1115 Cyclical vomiting syndrome unrelated to migraine: Secondary | ICD-10-CM | POA: Diagnosis not present

## 2018-11-12 MED FILL — TOPIRAMATE 100 MG TABLET: 100 | 30 days supply | Qty: 30 | Fill #0

## 2018-11-15 DIAGNOSIS — F411 Generalized anxiety disorder: Secondary | ICD-10-CM | POA: Diagnosis not present

## 2018-11-15 DIAGNOSIS — F3181 Bipolar II disorder: Secondary | ICD-10-CM | POA: Diagnosis not present

## 2019-05-03 ENCOUNTER — Ambulatory Visit: Payer: 59 | Attending: Internal Medicine

## 2019-05-03 DIAGNOSIS — Z20822 Contact with and (suspected) exposure to covid-19: Secondary | ICD-10-CM | POA: Diagnosis not present

## 2019-05-04 LAB — NOVEL CORONAVIRUS, NAA: SARS-CoV-2, NAA: NOT DETECTED

## 2019-05-21 IMAGING — CT CT ABD-PELV W/O CM
2 of 4 series · 15 of 46 positions shown, 17 images · non-contrast
Comparison: 06/06/2016

CLINICAL DATA: Pelvic pain, micro hematuria, active nausea and
vomiting, intractable Pt has past hx of kidney stone

EXAM:
CT ABDOMEN AND PELVIS WITHOUT CONTRAST
TECHNIQUE: Multidetector CT imaging of the abdomen and pelvis was performed
following the standard protocol without IV contrast.

[Series 2: renal stone 5.00 br40 s3 ax · axial · 0.67mm/px · z∈[+942,+1372]mm · 12 of 94 slices shown, 14 images]
[im 4/94  soft-tissue]
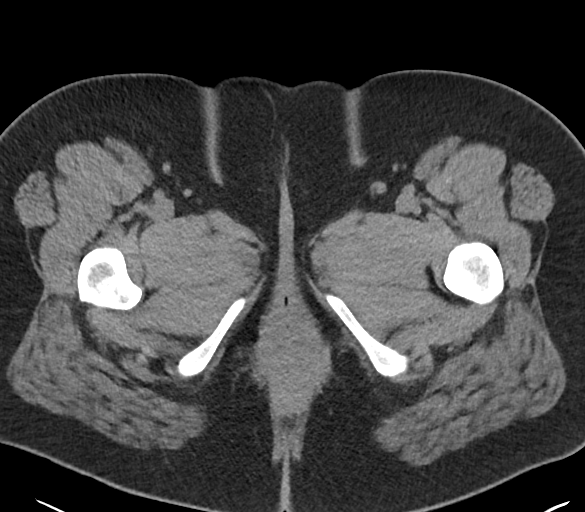
[im 4/94  bone]
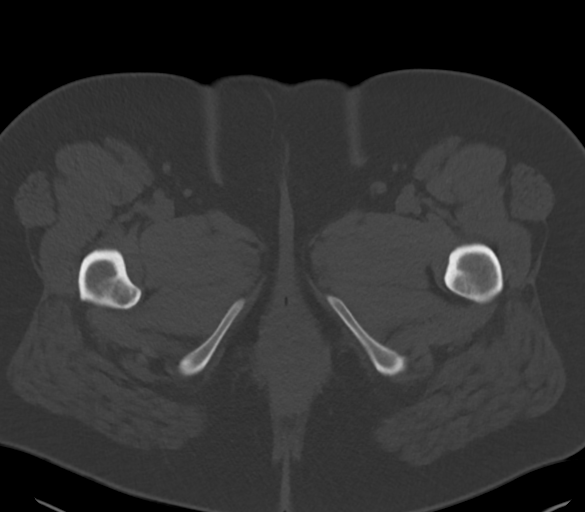
[im 12/94  soft-tissue]
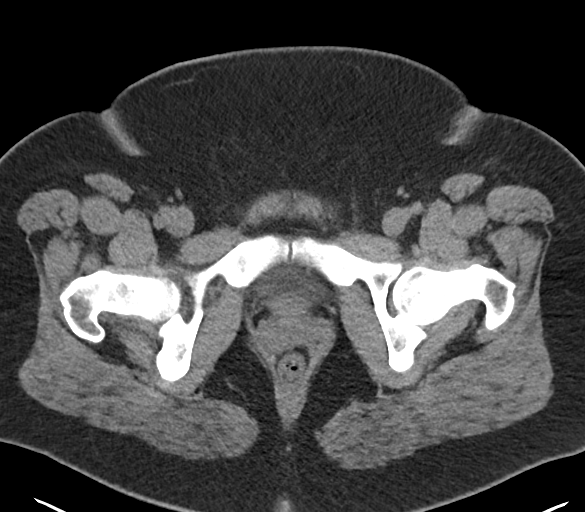
[im 20/94  soft-tissue]
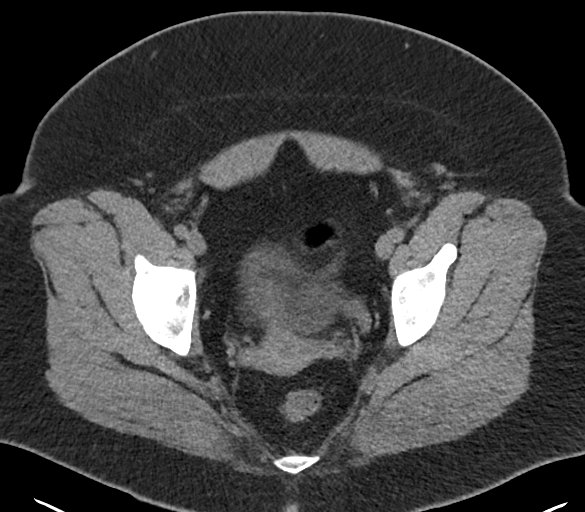
[im 28/94  soft-tissue]
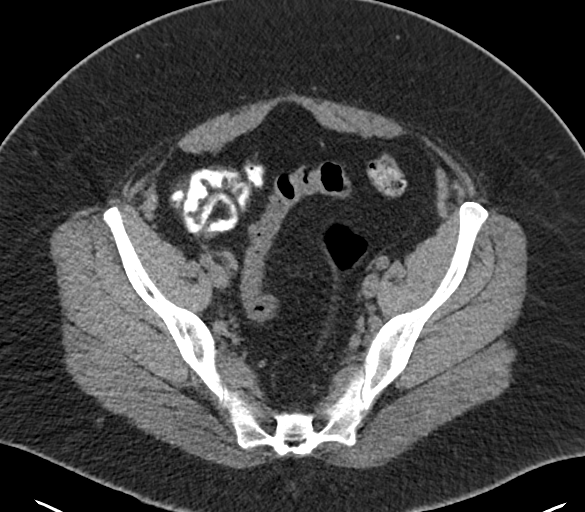
[im 35/94  soft-tissue]
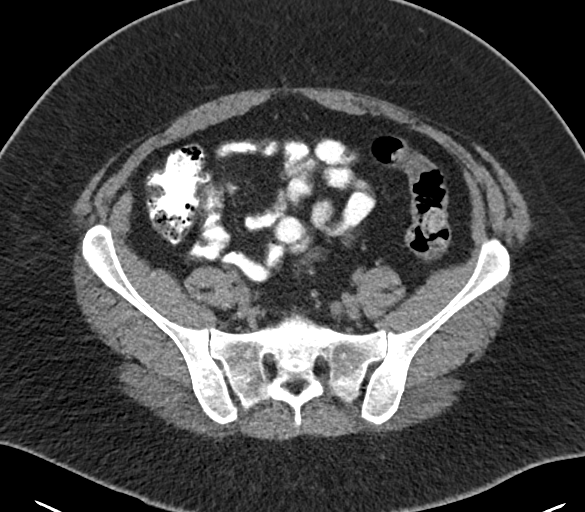
[im 43/94  soft-tissue]
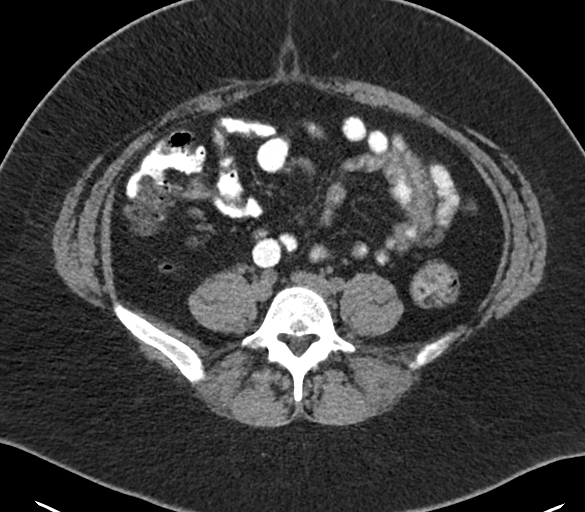
[im 51/94  soft-tissue]
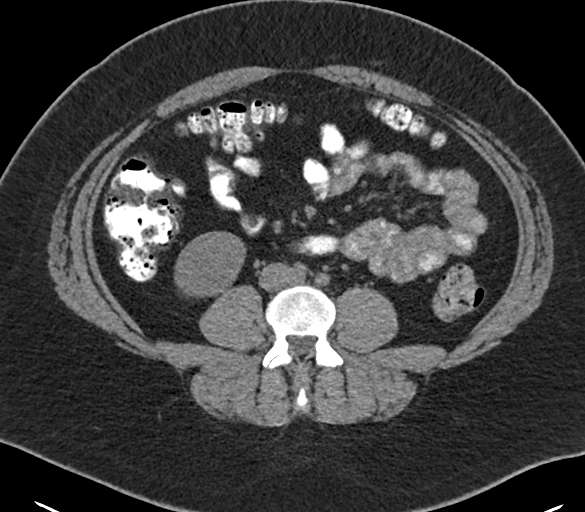
[im 59/94  soft-tissue]
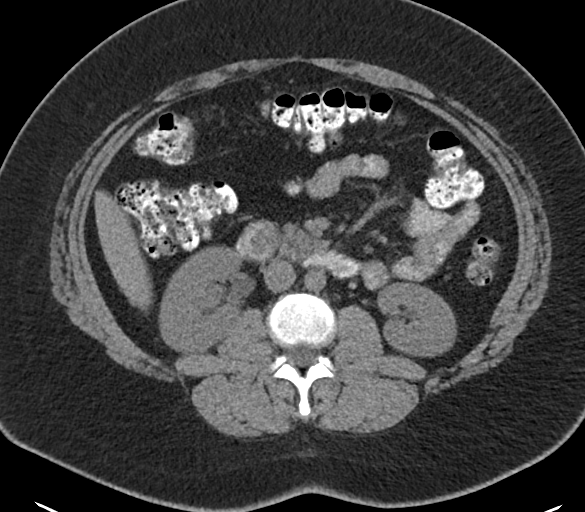
[im 66/94  soft-tissue]
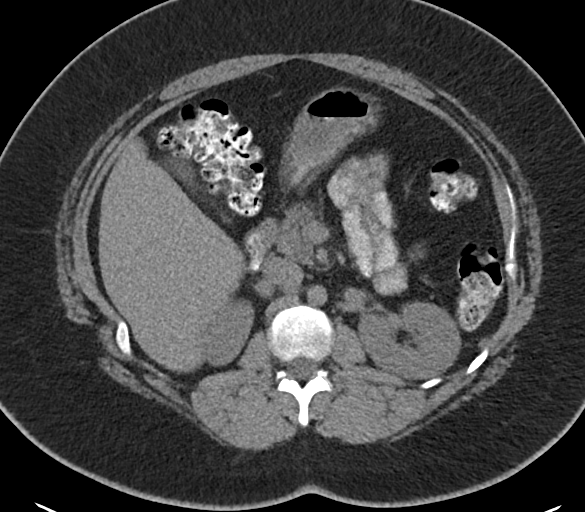
[im 66/94  bone]
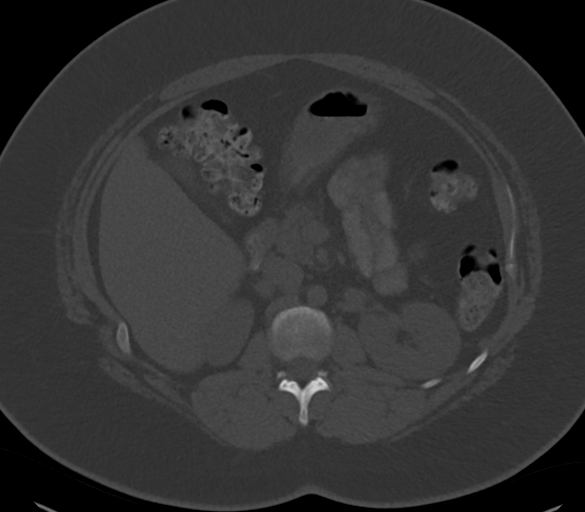
[im 74/94  soft-tissue]
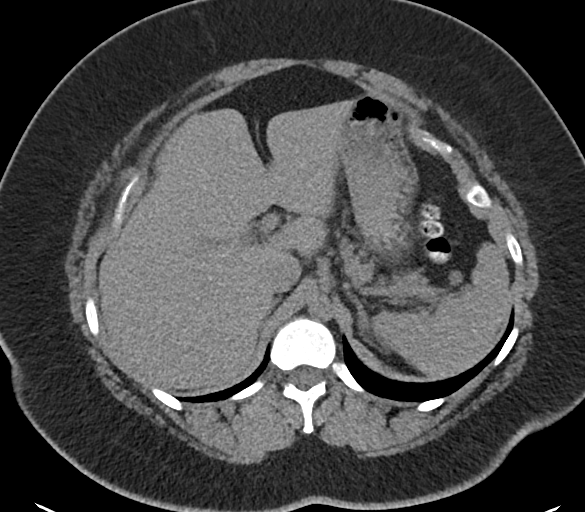
[im 82/94  soft-tissue]
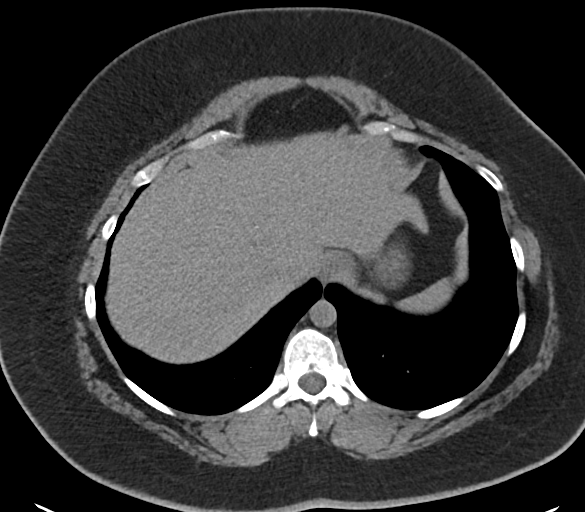
[im 90/94  soft-tissue]
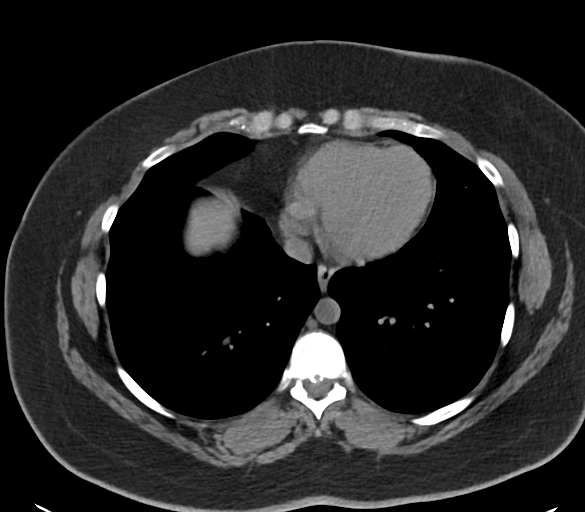

[Series 6: renal stone 2.00 br40 s3 cor · coronal · 0.77mm/px · 3 of 172 slices shown]
[im 58/172  soft-tissue]
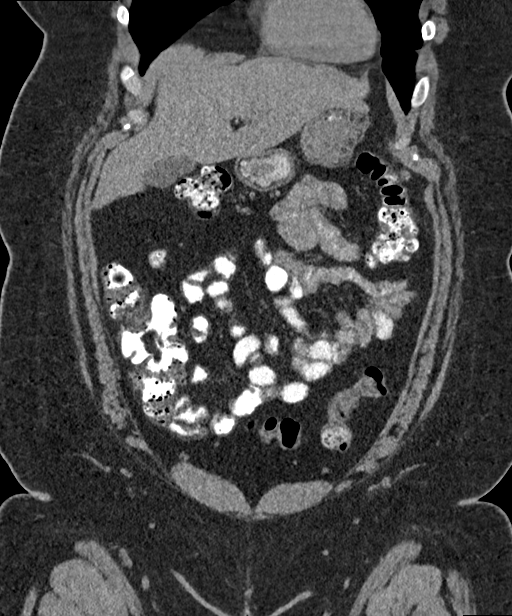
[im 77/172  soft-tissue]
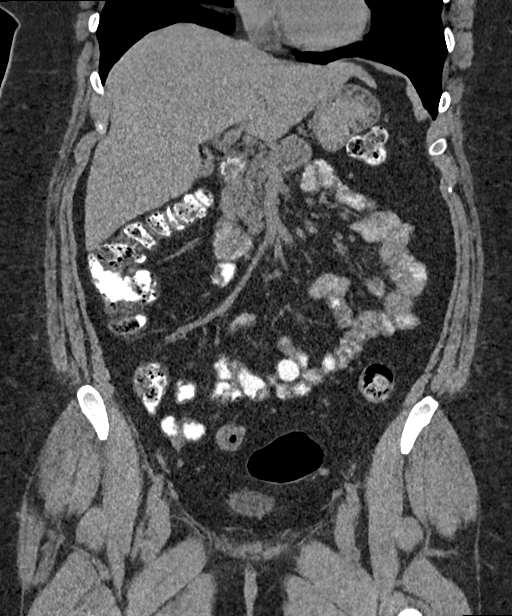
[im 96/172  soft-tissue]
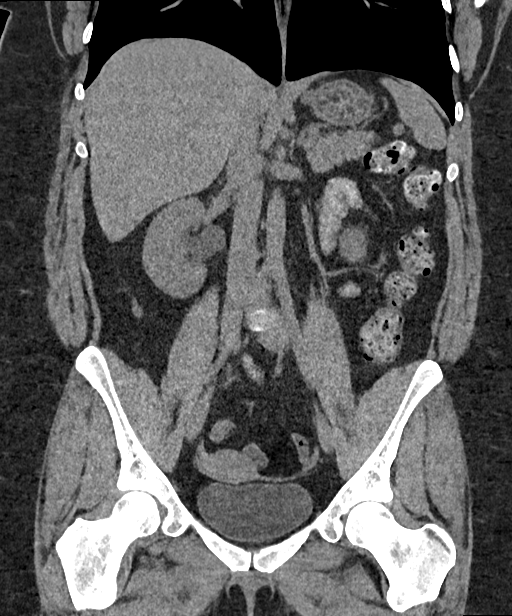

[15 of 46 positions shown; findings below may reference images not displayed]

FINDINGS: Lower chest: Clear lung bases.  Heart normal size.

Hepatobiliary: No focal liver abnormality is seen. No gallstones,
gallbladder wall thickening, or biliary dilatation.

Pancreas: Unremarkable. No pancreatic ductal dilatation or
surrounding inflammatory changes.

Spleen: Normal in size without focal abnormality.

Adrenals/Urinary Tract: No adrenal masses.

Kidneys normal size, orientation and position. No masses, stones or
hydronephrosis. Normal ureters. Normal bladder.

Stomach/Bowel: Stomach is within normal limits. Appendix appears
normal. No evidence of bowel wall thickening, distention, or
inflammatory changes.

Vascular/Lymphatic: No significant vascular findings are present. No
enlarged abdominal or pelvic lymph nodes.

Reproductive: Uterus and bilateral adnexa are unremarkable.

Other: No abdominal wall hernia or abnormality. No abdominopelvic
ascites.

Musculoskeletal: No fracture or acute finding. No osteoblastic or
osteolytic lesions. Mild disc degenerative changes of the lower
thoracic and lower lumbar spine.
IMPRESSION: 1. No significant abnormality. No acute findings in the abdomen or
pelvis. No findings to account for pelvic pain microhematuria. No
renal or ureteral stones or obstructive uropathy.

## 2019-07-18 DIAGNOSIS — F419 Anxiety disorder, unspecified: Secondary | ICD-10-CM | POA: Diagnosis not present

## 2019-07-18 DIAGNOSIS — Z3041 Encounter for surveillance of contraceptive pills: Secondary | ICD-10-CM | POA: Diagnosis not present

## 2019-07-18 DIAGNOSIS — R1115 Cyclical vomiting syndrome unrelated to migraine: Secondary | ICD-10-CM | POA: Diagnosis not present

## 2019-07-18 MED FILL — VYLIBRA 0.25-35 MG-MCG TABS: 0.25-35 | 84 days supply | Qty: 84 | Fill #0

## 2019-07-18 MED FILL — hydrOXYzine HCL 25 MG TABS: 25 | 30 days supply | Qty: 90 | Fill #0

## 2019-07-18 MED FILL — FLUOXETINE HCL 10 MG TABS: 10 | 30 days supply | Qty: 30 | Fill #0

## 2019-08-16 DIAGNOSIS — F419 Anxiety disorder, unspecified: Secondary | ICD-10-CM | POA: Diagnosis not present

## 2019-08-16 MED FILL — FLUOXETINE HCL 20 MG TABS: 20 | 90 days supply | Qty: 90 | Fill #0

## 2019-10-22 MED FILL — VYLIBRA 0.25-35 MG-MCG TABS: 0.25-35 | 84 days supply | Qty: 84 | Fill #1

## 2019-12-11 MED FILL — DEXAMETHASONE 4 MG TABLET: 4 | 1 days supply | Qty: 2 | Fill #0

## 2019-12-11 MED FILL — METHYLPREDNISOLONE 4 MG TBP: 4 | 6 days supply | Qty: 21 | Fill #0

## 2020-03-23 ENCOUNTER — Other Ambulatory Visit (HOSPITAL_BASED_OUTPATIENT_CLINIC_OR_DEPARTMENT_OTHER): Payer: Self-pay | Admitting: Physician Assistant

## 2020-03-23 DIAGNOSIS — M5441 Lumbago with sciatica, right side: Secondary | ICD-10-CM | POA: Diagnosis not present

## 2020-03-23 DIAGNOSIS — M9903 Segmental and somatic dysfunction of lumbar region: Secondary | ICD-10-CM | POA: Diagnosis not present

## 2020-03-23 DIAGNOSIS — G8929 Other chronic pain: Secondary | ICD-10-CM | POA: Diagnosis not present

## 2020-03-23 DIAGNOSIS — M5431 Sciatica, right side: Secondary | ICD-10-CM | POA: Diagnosis not present

## 2020-03-23 MED FILL — CYCLOBENZAPRINE HCL 10 MG T: 10 | 15 days supply | Qty: 15 | Fill #0

## 2020-03-23 MED FILL — NAPROXEN 500 MG TABS: 500 | 14 days supply | Qty: 28 | Fill #0

## 2020-03-25 DIAGNOSIS — M9903 Segmental and somatic dysfunction of lumbar region: Secondary | ICD-10-CM | POA: Diagnosis not present

## 2020-03-25 DIAGNOSIS — M5431 Sciatica, right side: Secondary | ICD-10-CM | POA: Diagnosis not present

## 2020-03-30 DIAGNOSIS — M9903 Segmental and somatic dysfunction of lumbar region: Secondary | ICD-10-CM | POA: Diagnosis not present

## 2020-03-30 DIAGNOSIS — M5431 Sciatica, right side: Secondary | ICD-10-CM | POA: Diagnosis not present

## 2020-03-31 DIAGNOSIS — M5431 Sciatica, right side: Secondary | ICD-10-CM | POA: Diagnosis not present

## 2020-03-31 DIAGNOSIS — M9903 Segmental and somatic dysfunction of lumbar region: Secondary | ICD-10-CM | POA: Diagnosis not present

## 2021-07-12 ENCOUNTER — Other Ambulatory Visit (HOSPITAL_BASED_OUTPATIENT_CLINIC_OR_DEPARTMENT_OTHER): Payer: Self-pay

## 2021-07-12 MED ORDER — ACID GONE 95-358 MG/15ML PO SUSP
ORAL | 1 refills | Status: DC
  Filled 2021-07-12: qty 355, 8d supply, fill #0

## 2021-07-12 MED ORDER — PANTOPRAZOLE SODIUM 40 MG PO TBEC
DELAYED_RELEASE_TABLET | ORAL | 0 refills | Status: DC
  Filled 2021-07-12: qty 30, 15d supply, fill #0

## 2021-07-12 MED ORDER — METOCLOPRAMIDE HCL 10 MG PO TABS
ORAL_TABLET | ORAL | 0 refills | Status: DC
  Filled 2021-07-12: qty 20, 5d supply, fill #0

## 2021-07-12 MED ORDER — FAMOTIDINE 20 MG PO TABS
ORAL_TABLET | ORAL | 0 refills | Status: DC
  Filled 2021-07-12: qty 30, 15d supply, fill #0

## 2021-07-13 ENCOUNTER — Other Ambulatory Visit (HOSPITAL_BASED_OUTPATIENT_CLINIC_OR_DEPARTMENT_OTHER): Payer: Self-pay

## 2021-07-22 ENCOUNTER — Other Ambulatory Visit (HOSPITAL_BASED_OUTPATIENT_CLINIC_OR_DEPARTMENT_OTHER): Payer: Self-pay

## 2024-01-24 ENCOUNTER — Inpatient Hospital Stay (HOSPITAL_COMMUNITY)
Admission: AD | Admit: 2024-01-24 | Discharge: 2024-01-28 | DRG: 885 | Disposition: A | Discharge status: Home / Self Care | Source: Intra-hospital

## 2024-01-24 ENCOUNTER — Other Ambulatory Visit: Payer: Self-pay

## 2024-01-24 ENCOUNTER — Ambulatory Visit (HOSPITAL_COMMUNITY)
Admission: EM | Admit: 2024-01-24 | Discharge: 2024-01-24 | Disposition: A | Discharge status: Other Acute Inpatient Hospital | Attending: Psychiatry | Admitting: Psychiatry

## 2024-01-24 DIAGNOSIS — Z91148 Patient's other noncompliance with medication regimen for other reason: Secondary | ICD-10-CM

## 2024-01-24 DIAGNOSIS — R45851 Suicidal ideations: Secondary | ICD-10-CM | POA: Diagnosis present

## 2024-01-24 DIAGNOSIS — Z23 Encounter for immunization: Secondary | ICD-10-CM

## 2024-01-24 DIAGNOSIS — R001 Bradycardia, unspecified: Secondary | ICD-10-CM | POA: Insufficient documentation

## 2024-01-24 DIAGNOSIS — F331 Major depressive disorder, recurrent, moderate: Principal | ICD-10-CM | POA: Diagnosis present

## 2024-01-24 DIAGNOSIS — Z833 Family history of diabetes mellitus: Secondary | ICD-10-CM

## 2024-01-24 DIAGNOSIS — F3181 Bipolar II disorder: Principal | ICD-10-CM | POA: Diagnosis present

## 2024-01-24 DIAGNOSIS — Z818 Family history of other mental and behavioral disorders: Secondary | ICD-10-CM | POA: Diagnosis not present

## 2024-01-24 DIAGNOSIS — F3132 Bipolar disorder, current episode depressed, moderate: Secondary | ICD-10-CM | POA: Insufficient documentation

## 2024-01-24 DIAGNOSIS — E282 Polycystic ovarian syndrome: Secondary | ICD-10-CM | POA: Diagnosis present

## 2024-01-24 DIAGNOSIS — F319 Bipolar disorder, unspecified: Principal | ICD-10-CM | POA: Diagnosis present

## 2024-01-24 DIAGNOSIS — F3281 Premenstrual dysphoric disorder: Secondary | ICD-10-CM | POA: Diagnosis present

## 2024-01-24 DIAGNOSIS — F411 Generalized anxiety disorder: Secondary | ICD-10-CM | POA: Diagnosis present

## 2024-01-24 DIAGNOSIS — F419 Anxiety disorder, unspecified: Secondary | ICD-10-CM | POA: Insufficient documentation

## 2024-01-24 DIAGNOSIS — F1210 Cannabis abuse, uncomplicated: Secondary | ICD-10-CM | POA: Insufficient documentation

## 2024-01-24 DIAGNOSIS — Z59869 Financial insecurity, unspecified: Secondary | ICD-10-CM | POA: Insufficient documentation

## 2024-01-24 LAB — POCT URINE DRUG SCREEN - MANUAL ENTRY (I-SCREEN)
POC Amphetamine UR: NOT DETECTED
POC Buprenorphine (BUP): NOT DETECTED
POC Cocaine UR: NOT DETECTED
POC Marijuana UR: POSITIVE — AB
POC Methadone UR: NOT DETECTED
POC Methamphetamine UR: NOT DETECTED
POC Morphine: NOT DETECTED
POC Oxazepam (BZO): NOT DETECTED
POC Oxycodone UR: NOT DETECTED
POC Secobarbital (BAR): NOT DETECTED

## 2024-01-24 LAB — COMPREHENSIVE METABOLIC PANEL WITH GFR
ALT: 20 U/L (ref 0–44)
AST: 21 U/L (ref 15–41)
Albumin: 4.1 g/dL (ref 3.5–5.0)
Alkaline Phosphatase: 49 U/L (ref 38–126)
Anion gap: 13 (ref 5–15)
BUN: 10 mg/dL (ref 6–20)
CO2: 22 mmol/L (ref 22–32)
Calcium: 9.7 mg/dL (ref 8.9–10.3)
Chloride: 103 mmol/L (ref 98–111)
Creatinine, Ser: 0.65 mg/dL (ref 0.44–1.00)
GFR, Estimated: >60 mL/min (ref >60)
Glucose, Bld: 79 mg/dL (ref 70–99)
Potassium: 4.2 mmol/L (ref 3.5–5.1)
Sodium: 138 mmol/L (ref 135–145)
Total Bilirubin: 0.6 mg/dL (ref 0.0–1.2)
Total Protein: 7 g/dL (ref 6.5–8.1)

## 2024-01-24 LAB — CBC WITH DIFFERENTIAL/PLATELET
Abs Immature Granulocytes: 0.02 K/uL (ref 0.00–0.07)
Basophils Absolute: 0 K/uL (ref 0.0–0.1)
Basophils Relative: 0 %
Eosinophils Absolute: 0.1 K/uL (ref 0.0–0.5)
Eosinophils Relative: 1 %
HCT: 41.4 % (ref 36.0–46.0)
Hemoglobin: 13.9 g/dL (ref 12.0–15.0)
Immature Granulocytes: 0 %
Lymphocytes Relative: 34 %
Lymphs Abs: 3.6 K/uL (ref 0.7–4.0)
MCH: 31.2 pg (ref 26.0–34.0)
MCHC: 33.6 g/dL (ref 30.0–36.0)
MCV: 92.8 fL (ref 80.0–100.0)
Monocytes Absolute: 0.8 K/uL (ref 0.1–1.0)
Monocytes Relative: 7 %
Neutro Abs: 6.1 K/uL (ref 1.7–7.7)
Neutrophils Relative %: 58 %
Platelets: 285 K/uL (ref 150–400)
RBC: 4.46 MIL/uL (ref 3.87–5.11)
RDW: 12.6 % (ref 11.5–15.5)
WBC: 10.6 K/uL — ABNORMAL HIGH (ref 4.0–10.5)
nRBC: 0 % (ref 0.0–0.2)

## 2024-01-24 LAB — LIPID PANEL
Cholesterol: 127 mg/dL (ref 0–200)
HDL: 45 mg/dL (ref >40)
LDL Cholesterol: 59 mg/dL (ref 0–99)
Total CHOL/HDL Ratio: 2.8 ratio
Triglycerides: 113 mg/dL (ref <150)
VLDL: 23 mg/dL (ref 0–40)

## 2024-01-24 LAB — MAGNESIUM: Magnesium: 1.8 mg/dL (ref 1.7–2.4)

## 2024-01-24 LAB — ETHANOL: Alcohol, Ethyl (B): <15 mg/dL (ref <15)

## 2024-01-24 LAB — GLUCOSE, CAPILLARY: Glucose-Capillary: 97 mg/dL (ref 70–99)

## 2024-01-24 LAB — TSH: TSH: 1.097 u[IU]/mL (ref 0.350–4.500)

## 2024-01-24 LAB — POC URINE PREG, ED: Preg Test, Ur: NEGATIVE

## 2024-01-24 MED ORDER — LORAZEPAM 2 MG/ML IJ SOLN: 2.0000 mg | Freq: Three times a day (TID) | INTRAMUSCULAR | Status: DC | PRN

## 2024-01-24 MED ORDER — DIPHENHYDRAMINE HCL 50 MG/ML IJ SOLN: 50.0000 mg | Freq: Three times a day (TID) | INTRAMUSCULAR | Status: DC | PRN

## 2024-01-24 MED ORDER — DIPHENHYDRAMINE HCL 25 MG PO CAPS: 50.0000 mg | ORAL_CAPSULE | Freq: Three times a day (TID) | ORAL | Status: DC | PRN

## 2024-01-24 MED ORDER — NICOTINE 21 MG/24HR TD PT24: 21.0000 mg | MEDICATED_PATCH | Freq: Every day | TRANSDERMAL | Status: DC

## 2024-01-24 MED ORDER — ONDANSETRON HCL 4 MG/2ML IJ SOLN: 4.0000 mg | Freq: Once | INTRAMUSCULAR | Status: DC

## 2024-01-24 MED ORDER — TRAZODONE HCL 50 MG PO TABS: 50.0000 mg | ORAL_TABLET | Freq: Every evening | ORAL | Status: DC | PRN

## 2024-01-24 MED ORDER — ONDANSETRON 4 MG PO TBDP: 4.0000 mg | ORAL_TABLET | Freq: Once | ORAL | Status: DC

## 2024-01-24 MED ORDER — ONDANSETRON 4 MG PO TBDP
4.0000 mg | ORAL_TABLET | Freq: Three times a day (TID) | ORAL | Status: DC | PRN
  Administered 2024-01-24: 4 mg via ORAL
  Filled 2024-01-24: qty 1

## 2024-01-24 MED ORDER — HALOPERIDOL LACTATE 5 MG/ML IJ SOLN: 5.0000 mg | Freq: Three times a day (TID) | INTRAMUSCULAR | Status: DC | PRN

## 2024-01-24 MED ORDER — HYDROXYZINE HCL 25 MG PO TABS
25.0000 mg | ORAL_TABLET | Freq: Three times a day (TID) | ORAL | Status: DC | PRN
  Administered 2024-01-25 (×2): 25 mg via ORAL
  Filled 2024-01-24 (×2): qty 1

## 2024-01-24 MED ORDER — ALUM & MAG HYDROXIDE-SIMETH 200-200-20 MG/5ML PO SUSP: 30.0000 mL | ORAL | Status: DC | PRN

## 2024-01-24 MED ORDER — NICOTINE 21 MG/24HR TD PT24
21.0000 mg | MEDICATED_PATCH | Freq: Every day | TRANSDERMAL | Status: DC
  Administered 2024-01-25: 21 mg via TRANSDERMAL
  Filled 2024-01-24 (×2): qty 1

## 2024-01-24 MED ORDER — ACETAMINOPHEN 325 MG PO TABS: 650.0000 mg | ORAL_TABLET | Freq: Four times a day (QID) | ORAL | Status: DC | PRN

## 2024-01-24 MED ORDER — MAGNESIUM HYDROXIDE 400 MG/5ML PO SUSP: 30.0000 mL | Freq: Every day | ORAL | Status: DC | PRN

## 2024-01-24 MED ORDER — HYDROXYZINE HCL 25 MG PO TABS
25.0000 mg | ORAL_TABLET | Freq: Four times a day (QID) | ORAL | Status: DC | PRN
  Administered 2024-01-24: 25 mg via ORAL
  Filled 2024-01-24: qty 1

## 2024-01-24 MED ORDER — DIPHENHYDRAMINE HCL 50 MG PO CAPS: 50.0000 mg | ORAL_CAPSULE | Freq: Three times a day (TID) | ORAL | Status: DC | PRN

## 2024-01-24 MED ORDER — HALOPERIDOL LACTATE 5 MG/ML IJ SOLN: 10.0000 mg | Freq: Three times a day (TID) | INTRAMUSCULAR | Status: DC | PRN

## 2024-01-24 MED ORDER — HALOPERIDOL 5 MG PO TABS: 5.0000 mg | ORAL_TABLET | Freq: Three times a day (TID) | ORAL | Status: DC | PRN

## 2024-01-24 MED ORDER — ONDANSETRON HCL 4 MG/2ML IJ SOLN
4.0000 mg | Freq: Once | INTRAMUSCULAR | Status: AC
  Administered 2024-01-24: 4 mg via INTRAMUSCULAR
  Filled 2024-01-24: qty 2
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
## 2024-01-24 NOTE — Progress Notes (Signed)
 Pt has been accepted to [facility] on 01/24/2024 . Bed assignment:303-2   Pt meets inpatient criteria per Gaylan Mccoy, NP   Attending Physician will be Dr. Raliegh    Report can be called to: -Adult unit: [phone]  Pt can arrive after pending labs   Care Team Notified: [facility] Cherylynn Ernst, RN, Zaira Byrd, RN, Oluwatosin Olasunkanmi, NP

## 2024-01-24 NOTE — ED Notes (Signed)
 Safe Transport called for Transfer to [facility], state 30 min.

## 2024-01-24 NOTE — ED Notes (Signed)
 Report called to RN [facility], pending transfer to [facility] at 10pm.

## 2024-01-24 NOTE — ED Notes (Signed)
 Patient presents anxious regarding unfamiliar setting and tearful. Patient denied any anxiety medication and agreed to let staff know if she required any anxiety medication. Patient took a shower to help herself feel better. Patient remains cooperative in unit.

## 2024-01-24 NOTE — ED Notes (Signed)
 Pt reports when she is nervous she vomits,  NP Angela at bedside to eval pt.  IM Zofran  given.

## 2024-01-24 NOTE — ED Provider Notes (Signed)
 Behavioral Health Urgent Care Medical Screening Exam  Patient Name: Wendy Bridges MRN: [id number] Date of Evaluation: 01/24/24 Chief Complaint: I need some help Diagnosis:  Final diagnoses:  Major depressive disorder, recurrent episode, moderate (HCC)  Bipolar affective disorder, currently depressed, moderate (HCC)    History of Present illness: Wendy Bridges 28 y.o., female patient presented to [facility] as a voluntary walk in accompanied by her mother with complaints of I need some help, either medication or therapy. Lilly A Laurel, is seen face to face by this provider, alone and consulted with Dr. Cole; and chart reviewed on 01/24/24.  On evaluation Wendy Bridges reports she has been struggling for a while, though she does not want to act on her thoughts of suicide. However, yesterday, she left goodbye letters for her nieces and nephew, and her plan was to take pills or find pills. She has not obtained any pills, but stated that would have been the plan, although she did not intend to act on it.  She then told a friend about the letter, who encouraged her to tell her mother. Her mother subsequently brought her to the hospital. The patient denies current suicidal ideation but was noted to be crying during the assessment. She endorses crying spells, loss of interest in previously enjoyed activities, feelings of guilt, low energy, and anxiety.  She denies any history of self-injurious behavior, auditory or visual hallucinations, paranoia, trauma, or abuse. She reports receiving significant financial help but feels she is not using it appropriately and that her life should be better than her current situation. She lives with her parents, works full-time as a publishing copy, and has some college classes. She reports using THC approximately three times per week, with her last use being yesterday. Alcohol use is reported as social. She previously tried Abilify several  months ago but reports poor compliance. Her medical history includes polycystic ovary syndrome.  She reports a family history of mental health issues, though not specific, and describes her mother's side as "crazy." She reports sleeping 3-7 hours nightly and states her appetite is fair. Plan: The patient has been recommended for inpatient psychiatric hospitalization and is in agreement with the plan.  During evaluation [name] is sitting in an upright position in no acute distress. She is alert & oriented x 4, calm, cooperative and attentive for this assessment.  Her mood is depressed with congruent affect, crying during this assessment. She has normal speech, and behavior.  Objectively there is no evidence of psychosis/mania or delusional thinking. Pt does not appear to be responding to internal or external stimuli.  Patient is able to converse coherently, goal directed thoughts, no distractibility, or pre-occupation. She also denies suicidal/self-harm/homicidal ideation, psychosis, and paranoia.  Patient answered question appropriately.     Flowsheet Row ED from 01/24/2024 in [facility]  C-SSRS RISK CATEGORY Moderate Risk    Psychiatric Specialty Exam  Presentation  General Appearance:Appropriate for Environment  Eye Contact:Good  Speech:Clear and Coherent  Speech Volume:Normal  Handedness:Right   Mood and Affect  Mood: Depressed  Affect: Congruent   Thought Process  Thought Processes: Coherent  Descriptions of Associations:Intact  Orientation:Full (Time, Place and Person)  Thought Content:WDL    Hallucinations:None  Ideas of Reference:None  Suicidal Thoughts:Yes, Passive (Wrote a note yesterday to her family) Without Intent; Without Plan  Homicidal Thoughts:No data recorded  Sensorium  Memory: Immediate Good  Judgment: Fair  Insight: [employer]  Concentration: Fair  Attention  Span: Fair  Recall: Good  Fund of Knowledge: Good  Language: Good   Psychomotor Activity  Psychomotor Activity: Normal   Assets  Assets: Communication Skills; Desire for Improvement; Social Support; Physical Health   Sleep  Sleep: Fair  Number of hours:  7   Physical Exam: Physical Exam Vitals reviewed.  Constitutional:      Appearance: Normal appearance.  HENT:     Head: Normocephalic and atraumatic.     Nose: Nose normal.  Cardiovascular:     Rate and Rhythm: Normal rate.  Pulmonary:     Effort: Pulmonary effort is normal.  Musculoskeletal:        General: Normal range of motion.     Cervical back: Normal range of motion.  Neurological:     Mental Status: She is alert and oriented to person, place, and time.  Psychiatric:        Attention and Perception: Attention and perception normal.        Mood and Affect: Mood is anxious and depressed. Affect is tearful.        Speech: Speech normal.        Behavior: Behavior normal. Behavior is cooperative.        Thought Content: Thought content includes suicidal ideation.        Cognition and Memory: Cognition normal.        Judgment: Judgment normal.    Review of Systems  Psychiatric/Behavioral:  Positive for depression, substance abuse and suicidal ideas. The patient is nervous/anxious.   All other systems reviewed and are negative.  Blood pressure 136/79, pulse 88, temperature 98.6 F (37 C), temperature source Oral, resp. rate 20, SpO2 99%. There is no height or weight on file to calculate BMI.  Musculoskeletal: Strength & Muscle Tone: within normal limits Gait & Station: normal Patient leans: N/A   [facility] MSE Discharge Disposition for Follow up and Recommendations: Based on my evaluation I certify that psychiatric inpatient services furnished can reasonably be expected to improve the patient's condition which I recommend transfer to an appropriate accepting facility.   Pt has been accepted to [facility];  pending bed availability  Basic labs ordered and pending: CBC, CMP, LIPID, A1C, UDS, Preg Test, EKG, Vit D   PRN Medications: -Tylenol , 650mg , oral, every 6 hours PRN, mild pain -Maalox/Mylanta 30mL, oral, every 4 hours PRN, indigestion -Atarax 25mg , oral, 3 times daily PRN, anxiety -Milk of Magnesia, 30 mL, oral, daily PRN, mild constipation -Desyrel, 50mg , oral, at bedtime PRN, sleep -Benadryl 50mg  at Q8HRS PO PRN - allergies Agitation protocol.  Pre-admit ordered entered.  Tosin Aranda Bihm, NP 01/24/2024, 1:58 PM

## 2024-01-24 NOTE — ED Notes (Signed)
 Patient admitted to obs unit. Patient presents calm and pleasant. Patient currently denies SI,HI, and A/V/H with no plan or intent. Patient is aware of pending transfer to [facility]. Patient remains cooperative in unit with no s/s of current distress.

## 2024-01-24 NOTE — BH Assessment (Signed)
 Comprehensive Clinical Assessment (CCA) Note  01/24/2024 Wendy Bridges [id number]  DISPOSITION: Per Gaylan Mccoy NP Pt is recommended for inpatient psychiatric admission.   The patient demonstrates the following risk factors for suicide: Chronic risk factors for suicide include: psychiatric disorder of Bipolar d/o. Acute risk factors for suicide include: social withdrawal/isolation. Protective factors for this patient include: positive social support, responsibility to others (children, family), and hope for the future. Considering these factors, the overall suicide risk at this point appears to be high. Patient is appropriate for outpatient follow up.   Per Triage assessment: "Wendy Bridges is 28 year old female presenting to [facility] unaccompanied. Pt states that she is Bipolar and has been having suicidal thoughts. Pt states that she had severe intrusive thoughts yesterday. Pt states she had thought of a plan yesterday and to take pills. Pt denies having a plan to end her life at this current moment. Pt mentions that she was taking Abilify, but stopped taking it because she is forgetful. Pt states that she is looking for medication at this time and therapy so she can manage her Bipolar Disorder. Pt denies alcohol use, Hi and AVH. Pt states that she smoked marijuana last night and has a hx of using on occasions."  With further assessment: Pt is a 28 yo female who presented voluntarily and accompanied by her mother who was not present during the assessment. Pt reported that yesterday she was having SI and wrote goodbye letters to her niece and nephew "just in case" she actually followed through in a suicide. Pt stated that she was planning to intentionally overdose on "pills." Pt denied any past suicide attempts and any inpatient psychiatric admissions. Pt stated that she is not "where I thought I'd be at this point in my life" and feels like a failure and a burden to her family. Pt stated "I wish I'd  made better choices."  Pt stated that she is not feeling suicidal currently but that "it comes and goes" with her moods. Pt stated she has been diagnosed with Bipolar d/o several years ago. Pt denied HI, NSSH, AVH and paranoia. Pt reported that she uses cannabis about 3 days a week and was using more frequently until she cut down recently. Pt reported that she drinks alcohol "socially" which is about once a month. Pt denied seeing any OP psychiatric providers currently and stated she is not currently prescribed any psychiatric medications.   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  Pt has never been married and has no children. Pt denied access to guns and any legal issues. Pt denied any childhood trauma or abuse. Pt stated that her sleep and appetite are erratic. Pt stated that she sleeps between 2-7 hours a night. Pt stated that she felt hopeless and helpless, like a failure, guilty/shameful, fatigued and lacking motivation for activities even preferred activities.   Chief Complaint:  Chief Complaint  Patient presents with   Suicidal Thoughts    Visit Diagnosis:  Bipolar d/o    CCA Screening, Triage and Referral (STR)  Patient Reported Information How did you hear about us ? Self  What Is the Reason for Your Visit/Call Today? Wendy Bridges is 28 year old female presenting to [facility] unaccompanied. Pt states that she is Bipolar and has been having suicidal thoughts. Pt states that she had severe intrusive thoughts yesterday. Pt states she had thought of a plan yesterday and to take pills. Pt denies having a plan  to end her life at this current moment. Pt mentions that she was taking Abilify, but stopped taking it because she is forgetful. Pt states that she is looking for medication at this time and therapy so she can manage her Bipolar Disorder. Pt denies alcohol use, Hi and AVH. Pt states that she smoked marijuana last night and has a hx of using on  occasions.  How Long Has This Been Causing You Problems? <Week  What Do You Feel Would Help You the Most Today? Medication(s); Treatment for Depression or other mood problem   Have You Recently Had Any Thoughts About Hurting Yourself? Yes  Are You Planning to Commit Suicide/Harm Yourself At This time? No   Flowsheet Row ED from 01/24/2024 in [facility]  C-SSRS RISK CATEGORY Moderate Risk    Have you Recently Had Thoughts About Hurting Someone Wendy Bridges? No  Are You Planning to Harm Someone at This Time? No  Explanation: na  Have You Used Any Alcohol or Drugs in the Past 24 Hours? Yes  How Long Ago Did You Use Drugs or Alcohol? Cannabis yesterday What Did You Use and How Much? marijuana, on occasions   Do You Currently Have a Therapist/Psychiatrist? No  Name of Therapist/Psychiatrist:    Have You Been Recently Discharged From Any Office Practice or Programs? No  Explanation of Discharge From Practice/Program: na    CCA Screening Triage Referral Assessment Type of Contact: Face-to-Face  Telemedicine Service Delivery:   Is this Initial or Reassessment?   Date Telepsych consult ordered in [facility]:    Time Telepsych consult ordered in [facility]:    Location of Assessment: [facility] Assessment Services  Provider Location: [facility] Assessment Services   Collateral Involvement: none   Does Patient Have a Automotive Engineer Guardian? No  Legal Guardian Contact Information: na  Copy of Legal Guardianship Form: -- (na)  Legal Guardian Notified of Arrival: -- (na)  Legal Guardian Notified of Pending Discharge: -- (na)  If Minor and Not Living with Parent(s), Who has Custody? adult  Is CPS involved or ever been involved? Never  Is APS involved or ever been involved? Never   Patient Determined To Be At Risk for Harm To Self or Others Based on Review of Patient Reported Information or Presenting Complaint? Yes, for Self-Harm  Method: Plan with  intent and identified person  Availability of Means: Has close by  Intent: Clearly intends on inflicting harm that could cause death  Notification Required: No need or identified person  Additional Information for Danger to Others Potential: -- (na)  Additional Comments for Danger to Others Potential: na  Are There Guns or Other Weapons in Your Home? No  Types of Guns/Weapons: na  Are These Weapons Safely Secured?                            -- (na)  Who Could Verify You Are Able To Have These Secured: na  Do You Have any Outstanding Charges, Pending Court Dates, Parole/Probation? pt denied  Contacted To Inform of Risk of Harm To Self or Others: -- (na)    Does Patient Present under Involuntary Commitment? No    [place] of Residence: [place]   Patient Currently Receiving the Following Services: Not Receiving Services   Determination of Need: Emergent (2 hours) (Per Gaylan Mccoy NP Pt is recommended for inpatient psychiatric admission.)   Options For Referral: Inpatient Hospitalization  CCA Biopsychosocial Patient Reported Schizophrenia/Schizoaffective Diagnosis in Past: No   Strengths: able to ask for and accept help   Mental Health Symptoms Depression:  Change in energy/activity; Difficulty Concentrating; Fatigue; Hopelessness; Increase/decrease in appetite; Sleep (too much or little); Tearfulness; Worthlessness   Duration of Depressive symptoms: Duration of Depressive Symptoms: Greater than two weeks   Mania:  None   Anxiety:   None   Psychosis:  None   Duration of Psychotic symptoms:    Trauma:  None   Obsessions:  None   Compulsions:  None   Inattention:  None   Hyperactivity/Impulsivity:  N/A   Oppositional/Defiant Behaviors:  N/A   Emotional Irregularity:  Recurrent suicidal behaviors/gestures/threats   Other Mood/Personality Symptoms:  none    Mental Status Exam Appearance and self-care  Stature:  Tall   Weight:   Overweight   Clothing:  Casual; Neat/clean   Grooming:  Normal   Cosmetic use:  Age appropriate   Posture/gait:  Normal   Motor activity:  Not Remarkable   Sensorium  Attention:  Normal   Concentration:  Normal   Orientation:  X5   Recall/memory:  Normal   Affect and Mood  Affect:  Depressed; Flat; Tearful   Mood:  Depressed; Dysphoric; Hopeless   Relating  Eye contact:  Normal   Facial expression:  Depressed   Attitude toward examiner:  Cooperative   Thought and Language  Speech flow: Clear and Coherent   Thought content:  Appropriate to Mood and Circumstances   Preoccupation:  None   Hallucinations:  None   Organization:  Coherent   [employer] of Knowledge:  Average   Intelligence:  Average   Abstraction:  Functional   Judgement:  Impaired   Reality Testing:  Adequate   Insight:  Lacking   Decision Making:  Confused   Social Functioning  Social Maturity:  Impulsive   Social Judgement:  Normal   Stress  Stressors:  Housing; Office Manager Ability:  Exhausted; Overwhelmed   Skill Deficits:  Decision making; Self-care; Self-control   Supports:  Family; Friends/Service system; Support needed     Religion: Religion/Spirituality Are You A Religious Person?: No How Might This Affect Treatment?: unknown  Leisure/Recreation: Leisure / Recreation Do You Have Hobbies?: Yes Leisure and Hobbies: walking and spending time in nature, writing, painting and crafts  Exercise/Diet: Exercise/Diet Do You Exercise?: Yes What Type of Exercise Do You Do?: Run/Walk How Many Times a Week Do You Exercise?: 4-5 times a week Have You Gained or Lost A Significant Amount of Weight in the Past Six Months?: No Do You Follow a Special Diet?: No Do You Have Any Trouble Sleeping?: Yes Explanation of Sleeping Difficulties: Pt stated that she gets between 2 to 7 hours each night.   CCA Employment/Education Employment/Work  Situation: Employment / Work Situation Employment Situation: Employed Work Stressors: none reported Patient's Job has Been Impacted by Current Illness: No Has Patient ever Been in Equities Trader?: No  Education: Education Is Patient Currently Attending School?: No Last Grade Completed: 13 Did You Product Manager?: Yes What Type of College Degree Do you Have?: no degree earned Did You Have An Individualized Education Program (IIEP): No Did You Have Any Difficulty At [employer]?: No Patient's Education Has Been Impacted by Current Illness: No   CCA Family/Childhood History Family and Relationship History: Family history Marital status: Single Does patient have children?: No  Childhood History:  Childhood History By whom was/is the patient raised?: Both parents Did  patient suffer any verbal/emotional/physical/sexual abuse as a child?: No Did patient suffer from severe childhood neglect?: No Has patient ever been sexually abused/assaulted/raped as an adolescent or adult?: No Was the patient ever a victim of a crime or a disaster?: No Witnessed domestic violence?: No Has patient been affected by domestic violence as an adult?: No       CCA Substance Use Alcohol/Drug Use: Alcohol / Drug Use Pain Medications: see MAR Prescriptions: see MAR Over the Counter: see MAR History of alcohol / drug use?: Yes Longest period of sobriety (when/how long): unknown Negative Consequences of Use:  (none reported) Withdrawal Symptoms:  (none reported) Substance #1 Name of Substance 1: cannabis 1 - Age of First Use: 19 1 - Amount (size/oz): varies 1 - Frequency: 3 x a week 1 - Duration: ongoing 1 - Last Use / Amount: yesterday 1 - Method of Aquiring: unknown 1- Route of Use: smoke Substance #2 Name of Substance 2: alcohol 2 - Age of First Use: 19 2 - Amount (size/oz): varies 2 - Frequency: once a month socially 2 - Duration: ongoing 2 - Last Use / Amount: weeks ago 2 - Method of  Aquiring: unknown 2 - Route of Substance Use: oral Substance #3 Name of Substance 3: nicotine- cigarettes 3 - Age of First Use: 19 3 - Amount (size/oz): 1 pack every 2 days 3 - Frequency: daily 3 - Duration: ongoing 3 - Last Use / Amount: today 3 - Method of Aquiring: purchase 3 - Route of Substance Use: smoke                   ASAM's:  Six Dimensions of Multidimensional Assessment  Dimension 1:  Acute Intoxication and/or Withdrawal Potential:      Dimension 2:  Biomedical Conditions and Complications:      Dimension 3:  Emotional, Behavioral, or Cognitive Conditions and Complications:     Dimension 4:  Readiness to Change:  Dimension 4:  Description of Readiness to Change criteria: Hx of Bipolar d/o  Dimension 5:  Relapse, Continued use, or Continued Problem Potential:     Dimension 6:  Recovery/Living Environment:     ASAM Severity Score:    ASAM Recommended Level of Treatment: ASAM Recommended Level of Treatment: Level I Outpatient Treatment   Substance use Disorder (SUD) Substance Use Disorder (SUD)  Checklist Symptoms of Substance Use: Persistent desire or unsuccessful efforts to cut down or control use  Recommendations for Services/Supports/Treatments: Recommendations for Services/Supports/Treatments Recommendations For Services/Supports/Treatments: Individual Therapy  Disposition Recommendation per psychiatric provider: We recommend inpatient psychiatric hospitalization when medically cleared. Patient is under voluntary admission status at this time; please IVC if attempts to leave hospital. Per Gaylan Mccoy NP Pt is recommended for inpatient psychiatric admission.    DSM5 Diagnoses: Patient Active Problem List   Diagnosis Date Noted   Anterior cruciate ligament tear 11/20/2012    Class: Acute     Referrals to Alternative Service(s): Referred to Alternative Service(s):   Place:   Date:   Time:    Referred to Alternative Service(s):   Place:    Date:   Time:    Referred to Alternative Service(s):   Place:   Date:   Time:    Referred to Alternative Service(s):   Place:   Date:   Time:     Jermarcus Mcfadyen T, Counselor

## 2024-01-24 NOTE — ED Notes (Signed)
 Safe Transport state 10:40pm for transfer of pt to [facility].

## 2024-01-25 ENCOUNTER — Encounter (HOSPITAL_COMMUNITY): Payer: Self-pay

## 2024-01-25 DIAGNOSIS — F3281 Premenstrual dysphoric disorder: Secondary | ICD-10-CM | POA: Diagnosis present

## 2024-01-25 DIAGNOSIS — F319 Bipolar disorder, unspecified: Principal | ICD-10-CM

## 2024-01-25 DIAGNOSIS — F411 Generalized anxiety disorder: Secondary | ICD-10-CM

## 2024-01-25 MED ORDER — QUETIAPINE FUMARATE 50 MG PO TABS
50.0000 mg | ORAL_TABLET | Freq: Every day | ORAL | Status: DC
  Administered 2024-01-25: 50 mg via ORAL
  Filled 2024-01-25: qty 1

## 2024-01-25 MED ORDER — INFLUENZA VIRUS VACC SPLIT PF (FLUZONE) 0.5 ML IM SUSY
0.5000 mL | PREFILLED_SYRINGE | INTRAMUSCULAR | Status: AC
  Administered 2024-01-26: 0.5 mL via INTRAMUSCULAR
  Filled 2024-01-25: qty 0.5

## 2024-01-25 MED ORDER — PROMETHAZINE HCL 25 MG PO TABS: 25.0000 mg | ORAL_TABLET | Freq: Once | ORAL | Status: AC

## 2024-01-25 MED ORDER — PROMETHAZINE HCL 25 MG PO TABS
25.0000 mg | ORAL_TABLET | Freq: Four times a day (QID) | ORAL | Status: DC | PRN
  Administered 2024-01-25 – 2024-01-26 (×2): 25 mg via ORAL
  Filled 2024-01-25 (×2): qty 1

## 2024-01-25 NOTE — Group Note (Signed)
 Occupational Therapy Group Note  Group Topic: Sleep Hygiene  Group Date: 01/25/2024 Start Time: 1500 End Time: 1530 Facilitators: Dot [place] MATSU, OT   Group Description: Group encouraged increased participation and engagement through topic focused on sleep hygiene. Patients reflected on the quality of sleep they typically receive and identified areas that need improvement. Group was given background information on sleep and sleep hygiene, including common sleep disorders. Group members also received information on how to improve one's sleep and introduced a sleep diary as a tool that can be utilized to track sleep quality over a length of time. Group session ended with patients identifying one or more strategies they could utilize or implement into their sleep routine in order to improve overall sleep quality.        Therapeutic Goal(s):  Identify one or more strategies to improve overall sleep hygiene  Identify one or more areas of sleep that are negatively impacted (sleep too much, too little, etc)     Participation Level: Engaged   Participation Quality: Independent   Behavior: Appropriate   Speech/Thought Process: Relevant   Affect/Mood: Appropriate   Insight: Fair   Judgement: Fair      Modes of Intervention: Education  Patient Response to Interventions:  Attentive   Plan: Continue to engage patient in OT groups 2 - 3x/week.  01/25/2024  [place] MATSU Dot, OT   Ramey Ketcherside, OT

## 2024-01-25 NOTE — Plan of Care (Signed)
 Reviewed vital signs, heart rate is improved to 68 and 76.

## 2024-01-25 NOTE — BHH Group Notes (Signed)
 Patient did attend OT group.

## 2024-01-25 NOTE — BHH Suicide Risk Assessment (Signed)
 [facility] Admission Suicide Risk Assessment   Nursing information obtained from:  Patient Demographic factors:  Caucasian Current Mental Status:  Suicidal ideation indicated by patient, Suicide plan Loss Factors:  NA Historical Factors:  NA Risk Reduction Factors:  Positive social support, Living with another person, especially a relative, Sense of responsibility to family  Total Time spent with patient: 30 minutes Principal Problem: Bipolar disorder, unspecified (HCC) Diagnosis:  Principal Problem:   Bipolar disorder, unspecified (HCC) Active Problems:   PMDD (premenstrual dysphoric disorder)   Generalized anxiety disorder  Subjective Data:  Wendy Bridges 28 y.o., female patient presented to [facility] as a voluntary walk in accompanied by her mother with complaints of I need some help, either medication or therapy. On evaluation Wendy Bridges reports she has been struggling for a while, though she does not want to act on her thoughts of suicide. However, yesterday, she left goodbye letters for her nieces and nephew, and her plan was to take pills or find pills.   Continued Clinical Symptoms:  Alcohol Use Disorder Identification Test Final Score (AUDIT): 1 The Alcohol Use Disorders Identification Test, Guidelines for Use in Primary Care, Second Edition.  [facility]). Score between 0-7:  no or low risk or alcohol related problems. Score between 8-15:  moderate risk of alcohol related problems. Score between 16-19:  high risk of alcohol related problems. Score 20 or above:  warrants further diagnostic evaluation for alcohol dependence and treatment.   CLINICAL FACTORS:   Severe Anxiety and/or Agitation Depression:   Anhedonia Hopelessness Impulsivity Severe Alcohol/Substance Abuse/Dependencies More than one psychiatric diagnosis Previous Psychiatric Diagnoses and Treatments Medical Diagnoses and Treatments/Surgeries   Musculoskeletal: Strength & Muscle Tone:  within normal limits Gait & Station: normal Patient leans: N/A  Psychiatric Specialty Exam:  Presentation  General Appearance:  Appropriate for Environment  Eye Contact: Good  Speech: Normal Rate  Speech Volume: Normal  Handedness: Right   Mood and Affect  Mood: Depressed  Affect: Appropriate   Thought Process  Thought Processes: Coherent  Descriptions of Associations:Intact  Orientation:Full (Time, Place and Person)  Thought Content:Logical  History of Schizophrenia/Schizoaffective disorder:No  Duration of Psychotic Symptoms:No data recorded Hallucinations:Hallucinations: None  Ideas of Reference:None  Suicidal Thoughts:Suicidal Thoughts: Yes, Passive SI Passive Intent and/or Plan: Without Intent; Without Plan  Homicidal Thoughts:Homicidal Thoughts: No   Sensorium  Memory: Immediate Good; Remote Good; Recent Good  Judgment: Good  Insight: Good   Executive Functions  Concentration: Good  Attention Span: Good  Recall: Good  Fund of Knowledge: Good  Language: Good   Psychomotor Activity  Psychomotor Activity: Psychomotor Activity: Normal   Assets  Assets: Communication Skills; Physical Health; Desire for Improvement; Social Support; Talents/Skills; Teacher, Music; Housing; Vocational/Educational; Leisure Time   Sleep  Sleep: Sleep: Good Number of Hours of Sleep: 7    Physical Exam: Physical Exam Constitutional:      Appearance: Normal appearance.  HENT:     Head: Normocephalic and atraumatic.     Nose: Nose normal.  Musculoskeletal:        General: Normal range of motion.     Cervical back: Normal range of motion.  Skin:    General: Skin is warm and dry.  Neurological:     General: No focal deficit present.     Mental Status: She is alert and oriented to person, place, and time. Mental status is at baseline.  Psychiatric:        Behavior: Behavior normal.  ROS Blood  pressure 136/80, pulse 76, temperature (!) 97.4 F (36.3 C), temperature source Oral, resp. rate 20, height 5' 6 (1.676 m), weight 93.3 kg, SpO2 98%. Body mass index is 33.18 kg/m.   COGNITIVE FEATURES THAT CONTRIBUTE TO RISK:  None    SUICIDE RISK:   MODERATE  PLAN OF CARE: Medication management, Group Therapy and Case Management  I certify that inpatient services furnished can reasonably be expected to improve the patient's condition.   Lamar Handler Jama Slain, DO 01/25/2024, 2:22 PM

## 2024-01-25 NOTE — Group Note (Signed)
 LCSW Group Therapy Note   Group Date: 01/25/2024 Start Time: 1100 End Time: 1200   Participation:  did not attend  Type of Therapy:  Group Therapy   Topic:  Stronger Together:  Building Healthy Relationships  Objective:  To explore loneliness, boundaries, and safe ways to build relationships.  Goals: - Recognize healthy vs. unhealthy relationships. - Learn safe ways to connect with others. - Psychiatrist.  Summary:  Participants discussed loneliness, healthy connections, and setting boundaries. They explored safe ways to meet people and shared personal experiences. Key insights were reinforced through discussion and quotes.  Therapeutic Modalities Used: - Cognitive Behavioral Therapy (CBT) Elements - Identifying unhealthy relationship patterns, challenging negative thoughts about connection. - Dialectical Behavior Therapy (DBT) Elements - Interpersonal effectiveness, setting and maintaining boundaries. - Supportive Group Therapy - Peer discussion, shared experiences, and emotional validation.   Vallerie Hentz O Saintclair Schroader, LCSWA 01/25/2024  4:29 PM

## 2024-01-25 NOTE — Group Note (Signed)
 Date:  01/25/2024 Time:  10:40 AM  Group Topic/Focus:  Nutrition Group    Participation Level:  Did Not Attend   Logan LITTIE Molly 01/25/2024, 10:40 AM

## 2024-01-25 NOTE — Group Note (Unsigned)
 Date:  01/26/2024 Time:  4:26 AM  Group Topic/Focus:  Wrap-Up Group:   The focus of this group is to help patients review their daily goal of treatment and discuss progress on daily workbooks.    Participation Level:  Active  Participation Quality:  Appropriate and Sharing  Affect:  Appropriate  Cognitive:  Appropriate  Insight: Appropriate  Engagement in Group:  Engaged  Modes of Intervention:  Activity and Socialization  Additional Comments:  Patient completed wrap up group sheet and shared with the group. Patient shared that she is feeling, I'm feeling much better than when I got here. I am hopeful that treatment will help. Patient rated her day a 6 out of 10 because I spent most of the day nauseous. Patient high point from today, making friends and seeing my mom and patients low point from today, being sick.   [name] 01/26/2024, 4:26 AM
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
## 2024-01-25 NOTE — Progress Notes (Signed)
(  Sleep Hours) -7.5  (Any PRNs that were needed, meds refused, or side effects to meds)- promethazine   (Any disturbances and when (visitation, over night)-none  (Concerns raised by the patient)- denies  (SI/HI/AVH)-denies

## 2024-01-25 NOTE — Group Note (Signed)
 Date:  01/25/2024 Time:  9:37 AM  Group Topic/Focus:  Emotional Education:   The focus of this group is to discuss what feelings/emotions are, and how they are experienced. Goals Group:   The focus of this group is to help patients establish daily goals to achieve during treatment and discuss how the patient can incorporate goal setting into their daily lives to aide in recovery. Orientation:   The focus of this group is to educate the patient on the purpose and policies of crisis stabilization and provide a format to answer questions about their admission.  The group details unit policies and expectations of patients while admitted.    Participation Level:  Did Not Attend   Wendy Bridges 01/25/2024, 9:37 AM

## 2024-01-25 NOTE — BHH Counselor (Signed)
 Adult Comprehensive Assessment  Patient ID: Wendy Bridges, female   DOB: October 18, 1995, 28 y.o.   MRN: [id number]  Information Source: Information source: Patient  Current Stressors:  Patient states their primary concerns and needs for treatment are:: to get on meds Patient states their goals for this hospitilization and ongoing recovery are:: to stop feeling nauseas right now. that is all i can think about Educational / Learning stressors: denies Employment / Job issues: I am working & I will probably need a note when I leave here Family Relationships: good Surveyor, Quantity / Lack of resources (include bankruptcy): denies Housing / Lack of housing: I live in [place] but when I am discharged, I am going back to [place] with my mom. Physical health (include injuries & life threatening diseases): denies Social relationships: denies Substance abuse: i use marijuana sometime  Living/Environment/Situation:  Living Arrangements: Parent Who else lives in the home?: mother How long has patient lived in current situation?:  a while What is atmosphere in current home: Comfortable  Family History:  Marital status: Single Does patient have children?: No  Childhood History:  By whom was/is the patient raised?: Both parents Description of patient's relationship with caregiver when they were a child: uta Patient's description of current relationship with people who raised him/her: uta How were you disciplined when you got in trouble as a child/adolescent?: uta Did patient suffer any verbal/emotional/physical/sexual abuse as a child?: No Did patient suffer from severe childhood neglect?: No Was the patient ever a victim of a crime or a disaster?: No Witnessed domestic violence?: No Has patient been affected by domestic violence as an adult?: No  Education:  Highest grade of school patient has completed: high school graduate Currently a consulting civil engineer?: No Learning disability?:  No  Employment/Work Situation:   Employment Situation: Employed Where is Patient Currently Employed?: she did not want to disclose How Long has Patient Been Employed?: uta Are You Satisfied With Your Job?: Yes Do You Work More Than One Job?: No Work Stressors: none reported Patient's Job has Been Impacted by Current Illness: No Has Patient ever Been in the [employer]?: No  Financial Resources:   Surveyor, Quantity resources: Income from employment Does patient have a representative payee or guardian?: No  Alcohol/Substance Abuse:   What has been your use of drugs/alcohol within the last 12 months?: marijuana  Social Support System:   Forensic Psychologist System: Poor Describe Community Support System: denies Type of faith/religion: uta How does patient's faith help to cope with current illness?: uta  Leisure/Recreation:   Do You Have Hobbies?: Yes Leisure and Hobbies: walking and spending time in nature, writing, painting and crafts  Strengths/Needs:   What is the patient's perception of their strengths?: uta Patient states they can use these personal strengths during their treatment to contribute to their recovery: uta Patient states these barriers may affect/interfere with their treatment: uta Patient states these barriers may affect their return to the community: uta Other important information patient would like considered in planning for their treatment: uta  Discharge Plan:   Currently receiving community mental health services: No Patient states concerns and preferences for aftercare planning are: to get back in therapy and on my meds Patient states they will know when they are safe and ready for discharge when: i feel better Does patient have access to transportation?: Yes Does patient have financial barriers related to discharge medications?: No Patient description of barriers related to discharge medications: denies medication barriers Plan for living situation  after discharge:  will move in with her mother Will patient be returning to same living situation after discharge?: No  Summary/Recommendations:   Summary and Recommendations (to be completed by the evaluator): Wendy Bridges is a 28 yr old woman that was admitted into [facility] on 01/24/2024 due to worsening mood symptoms and suicidal thoughts.  Wendy Bridges reports a history of Bipolar Disorder, diagnosed several years ago. She shared that  she experienced severe intrusive thoughts and suicidal ideation, during which she wrote goodbye letters to her family. She described having thoughts of intentionally overdosing on pills but stated that she did not act on these thoughts. She denied any previous suicide attempts or inpatient psychiatric admissions. Wendy Bridges endorsed ongoing symptoms of hopelessness, helplessness, fatigue, low motivation, and guilt, which have affected her ability to engage in activities she usually enjoys. She reported erratic sleep and appetite, sleeping anywhere from 2 to 7 hours per night. The patient stated that she was previously prescribed Abilify but discontinued the medication due to forgetfulness. She currently is not taking any psychiatric medications and is not connected with an outpatient provider. Wendy Bridges expressed a strong desire to resume medication management and participate in therapy to better manage her Bipolar Disorder. Wendy Bridges reported using cannabis approximately three times per week and acknowledged that she was using more heavily in the past but has recently cut back. She stated that she drinks alcohol socially, about once per month, and denied any other substance use. She denied homicidal ideation, non-suicidal self-harm, hallucinations, and paranoia. 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 She has never been married and does not have children. She denied access to firearms, any current legal issues, or a history of childhood trauma or abuse. While here,  Wendy Bridges can benefit from crisis stabilization, medication management, therapeutic milieu, and referrals for services.   Wendy Bridges. 01/25/2024

## 2024-01-25 NOTE — Progress Notes (Signed)
 Initial Treatment Plan 01/24/24 11:15 PM Wendy Bridges  MRN # [id number]       PATIENT STRESSORS: Medication noncompliance Suicidal ideation Substance use    PATIENT STRENGTHS: Motivation for treatment/growth Supportive family/friends Ability for insight Communication skills      PATIENT IDENTIFIED PROBLEMS: Depression   SI    Anxiety   Substance use                      DISCHARGE CRITERIA:  Improved stabilization in mood, thinking, and/or behavior. Verbal commitment to aftercare and medication compliance. Ability to meet basic life and health needs. Adequate post-discharge living arrangements. Need for constant or close observation no longer present. Reduction of life-threatening or endangering symptoms to within safe limits. Safe-care adequate arrangements made. Medical problems require only outpatient monitoring. Motivation to continue treatment in a less acute level of care.  PRELIMINARY DISCHARGE PLAN: Return to previous living arrangement. Outpatient therapy.  PATIENT/FAMILY INVOLVEMENT: This treatment plan has been presented to and reviewed with the patient, Wendy Bridges. The patient have been given the opportunity to ask questions and make suggestions.   [place], RN  01/24/24  11:20 PM

## 2024-01-25 NOTE — Plan of Care (Signed)
   Problem: Education: Goal: Knowledge of [facility] Education information/materials will improve Outcome: Progressing Goal: Emotional status will improve Outcome: Progressing Goal: Mental status will improve Outcome: Progressing Goal: Verbalization of understanding the information provided will improve Outcome: Progressing

## 2024-01-25 NOTE — Progress Notes (Signed)
 Admission note:   This patient is a 28 year old Caucasian female that presents to this facility with complaints of increasing anxiety and suicidal ideations. She reports that she wrote a goodbye letter to her nieces and nephews. Patient states that she had a plan to intentionally overdose on pills. Patient denies any current suicidal ideation. Denies SI/HI/AVH. Patient states that she was prescribed Abilify, but she has not been compliant with the medication because she keeps forgetting to take it. Upon arrival, patient was visibly anxious and worried, she was tearful on admission and dry heaving. She states, every time I get nervous I throw up. Patient denies anything helping her in the past, but she was given 4mg  Zofran  PO and IM prior to arrival. She is alert and oriented x4 and ambulatory.Patient made aware of rules and regulations of the unit. Patient oriented to the unit. Patient made aware of the q15 min checks. Consent signed. Patient denies having any questions/concerns at this time.

## 2024-01-25 NOTE — Group Note (Signed)
 Date:  01/25/2024 Time:  1:49 PM  Group Topic/Focus:  Emotional Education:   The focus of this group is to discuss what feelings/emotions are, and how they are experienced.    Participation Level:  Did Not Attend  Participation Quality:    Affect:    Cognitive:    Insight:   Engagement in Group:    Modes of Intervention:    Additional Comments:    Wendy Bridges 01/25/2024, 1:49 PM

## 2024-01-25 NOTE — Plan of Care (Signed)
   Problem: Education: Goal: Emotional status will improve Outcome: Progressing Goal: Mental status will improve Outcome: Progressing   Problem: Activity: Goal: Sleeping patterns will improve Outcome: Progressing

## 2024-01-25 NOTE — Plan of Care (Signed)
 ?  Problem: Education: ?Goal: Knowledge of [facility] Education information/materials will improve ?Outcome: Progressing ?Goal: Emotional status will improve ?Outcome: Progressing ?  ?Problem: Activity: ?Goal: Interest or engagement in activities will improve ?Outcome: Progressing ?  ?

## 2024-01-25 NOTE — Progress Notes (Signed)
(  Sleep Hours) - 3  (Any PRNs that were needed, meds refused, or side effects to meds)- n/a  (Any disturbances and when (visitation, over night)- n/a  (Concerns raised by the patient)- n/a  (SI/HI/AVH)- denies

## 2024-01-25 NOTE — Progress Notes (Signed)
   01/25/24 0000  Psych Admission Type (Psych Patients Only)  Admission Status Voluntary  Psychosocial Assessment  Patient Complaints Anxiety  Eye Contact Brief  Facial Expression Anxious  Affect Anxious  Speech Soft  Interaction Minimal  Motor Activity Fidgety;Restless  Appearance/Hygiene In scrubs  Behavior Characteristics Anxious  Mood Anxious  Thought Process  Coherency WDL  Content WDL  Delusions None reported or observed  Perception WDL  Hallucination None reported or observed  Judgment Impaired  Confusion None  Danger to Self  Current suicidal ideation? Denies  Agreement Not to Harm Self Yes  Description of Agreement Verbal  Danger to Others  Danger to Others None reported or observed

## 2024-01-25 NOTE — Progress Notes (Addendum)
 D. Pt presented extremely anxious with nausea and vomiting this am- was given ice chips and ginger ale,  hydroxyzine for anxiety. Pt's nausea has somewhat improved throughout the day, observed interacting well with peers and attending afternoon groups. Pt reported poor sleep last night- due to late admission- described her appetite and concentration as 'poor', and energy level as 'low'. Per pt's self inventory, pt rated her depression,hopelessness and anxiety a 6/8/7, respectively. Pt's stated goal today is to work on getting out of my head and envisioning a life I'm proud of, and try to combat negative thoughts. Pt currently denies SI/HI and AVH and doesn't appear to be responding to internal stimuli A. Labs and vitals monitored. Pt supported emotionally and encouraged to express concerns and ask questions.   R. Pt remains safe with 15 minute checks. Will continue POC.    01/25/24 1500  Psych Admission Type (Psych Patients Only)  Admission Status Voluntary  Psychosocial Assessment  Patient Complaints Anxiety  Eye Contact Fair  Facial Expression Anxious  Affect Anxious  Speech Logical/coherent  Interaction Assertive  Motor Activity Fidgety  Appearance/Hygiene Unremarkable  Behavior Characteristics Cooperative;Anxious  Mood Anxious;Pleasant  Thought Process  Coherency WDL  Content WDL  Delusions None reported or observed  Perception WDL  Hallucination None reported or observed  Judgment Impaired  Confusion None  Danger to Self  Current suicidal ideation? Denies  Agreement Not to Harm Self Yes  Description of Agreement agreed to contact staff before acting on harmful thoughts  Danger to Others  Danger to Others None reported or observed

## 2024-01-26 ENCOUNTER — Encounter (HOSPITAL_COMMUNITY): Payer: Self-pay

## 2024-01-26 MED ORDER — QUETIAPINE FUMARATE 50 MG PO TABS
75.0000 mg | ORAL_TABLET | Freq: Every day | ORAL | Status: DC
  Administered 2024-01-26: 75 mg via ORAL
  Filled 2024-01-26: qty 1

## 2024-01-26 NOTE — Group Note (Signed)
 Date:  01/26/2024 Time:  1:21 PM  Group Topic/Focus: Recreational Therapy Patient use their thinking skills to guess the lyrics of the song     Participation Level:  Active  Participation Quality:  Appropriate  Affect:  Appropriate  Cognitive:  Alert and Appropriate  Insight: Appropriate  Engagement in Group:  Engaged  Modes of Intervention:  Problem-solving  Additional Comments:  Patient attended Group  Wendy Bridges 01/26/2024, 1:21 PM

## 2024-01-26 NOTE — Group Note (Signed)
 Date:  01/26/2024 Time:  1:51 PM  Group Topic/Focus: Social Wellness Developing a Wellness Toolbox:   The focus of this group is to help patients develop a wellness toolbox with skills and strategies to promote recovery upon discharge.    Participation Level:  Active  Participation Quality:  Appropriate  Affect:  Appropriate  Cognitive:  Appropriate  Insight: Appropriate  Engagement in Group:  Engaged  Modes of Intervention:  Education  Additional Comments:  Patient attend  Wendy Bridges 01/26/2024, 1:51 PM

## 2024-01-26 NOTE — Group Note (Signed)
 Recreation Therapy Group Note   Group Topic:Leisure Education  Group Date: 01/26/2024 Start Time: 0940 End Time: 1012 Facilitators: Shivaan Tierno-McCall, LRT,CTRS Location: [address] Dayroom   Group Topic: Leisure Education  Goal Area(s) Addresses:  Patient will identify positive leisure activities.  Patient will identify one positive benefit of participation in leisure activities.   Behavioral Response: Engaged  Intervention: Leisure Group Game  Activity: Patient, MHT, and LRT participated in playing a trivia game of Guess the [place]. Teams took turns spinning the wheel. Whatever category ([address], Pop, Dance, Hip Hop, Indie and R&B) the wheel landed on, LRT read the lyric and that team would guess the missing lyric. The team with the most points wins the game.  Education:  Leisure Exposure, Pharmacist, Community, Discharge Planning  Education Outcome: Acknowledges education/In group clarification offered/Needs additional education   Affect/Mood: Appropriate   Participation Level: Engaged   Participation Quality: Independent   Behavior: Appropriate   Speech/Thought Process: Focused   Insight: Good   Judgement: Good   Modes of Intervention: Competitive Play   Patient Response to Interventions:  Engaged   Education Outcome:  In group clarification offered    Clinical Observations/Individualized Feedback: Pt attended and actively participated in group.      Plan: Continue to engage patient in RT group sessions 2-3x/week.   Adrina Armijo-McCall, LRT,CTRS 01/26/2024 11:46 AM

## 2024-01-26 NOTE — Group Note (Signed)
 Date:  01/26/2024 Time:  10:57 AM  Group Topic/Focus: Self-Regulation assessment and goals orientation  Goals Group:   The focus of this group is to help patients establish daily goals to achieve during treatment and discuss how the patient can incorporate goal setting into their daily lives to aide in recovery. Orientation:   The focus of this group is to educate the patient on the purpose and policies of crisis stabilization and provide a format to answer questions about their admission.  The group details unit policies and expectations of patients while admitted.    Participation Level:  Active  Participation Quality:  Appropriate  Affect:  Appropriate  Cognitive:  Alert and Appropriate  Insight: Appropriate  Engagement in Group:  Engaged  Modes of Intervention:  Discussion and Orientation  Additional Comments: Patient did attend and was engaged with activity.   Joylyn Duggin M Brynna Dobos 01/26/2024, 10:57 AM

## 2024-01-26 NOTE — Progress Notes (Signed)
 [facility] Inpatient Psychiatry Progress Note  Date: 01/26/24 Patient: Wendy Bridges MRN: [id number]  Principal Problem:   Bipolar disorder, unspecified (HCC) Active Problems:   PMDD (premenstrual dysphoric disorder)   Generalized anxiety disorder  Assessment and Plan:  Wendy Bridges is a 28 yo F, with history of bipolar disorder, mood lability, depression, and anxiety, who presented to ED on 01/24/24 with suicidal ideation. She reports history of depression mood, loss of interest, low energy/fatigue, gradually worsening over that last several weeks. She recently wrote good-bye letters to her family and friends and planned to commit suicide via pill overdose. After speaking to her mother and friend about her thoughts of self-harm, she was encouraged to seek help. She is currently committed to [facility] on a voluntary basis.    Diagnosis of Bipolar disorder is complicated by PMDD and GAD. Symptoms of PMDD are prominent and have worsened over the last 3 years. OBGYN referral would be helpful for Tx of PMDD and PCOS  01/26/2024 - I recommended the patient's she follow-up with her OB/GYN for hormone therapy of PMDD.  I will hold SSRIs for this treatment given concern of bipolar disorder.  There are studies showing that Seroquel can be used as a supplemental treatment and PMDD. - Tolerated introduction of Seroquel well with no observed reported side effects.  # Bipolar disorder, unspecified (HCC) - Increase Seroquel to 75 mg for mood stability, depression - Group therapy  #PMDD - Increase Seroquel 75 mg - Recommended patient follow-up with OB/GYN for hormone therapy - Group therapy   #GAD - Increase Seroquel 75 mg - Group therapy  Risk Assessment -mild  Discharge Planning Barriers to discharge: None Estimated length of stay: 5 days Predicted Discharge location: Back with family     Interval History and update:   On  assessment today the patient was participating in group.  She is calm, friendly and interacts well with me.  She reports sleep and appetite are both stable.  GI distress and nausea have both resolved.  She tolerated introduction of Seroquel well with no observed reported side effects.  We continue to discuss her diagnosis and I confirmed with the patient that she has a diagnosis of PMDD though I am hesitant to initiate SSRI therapy which is typically used for this due to comorbid likely bipolar disorder.  We discussed referral to OB/GYN for hormone therapy which is often effective in PMDD. Otherwise, she reports her mood is good and affect is euthymic.  Denies SI, HI and AVH.  Sleep and appetite are all stable.  Review of systems -Denies nausea, vomiting, diarrhea, constipation, chest pain, shortness of breath, EPS   Physical Exam MSK/Neuro - Normal gait and station    Mental Status Exam Appearance - Casually dressed, appropriate hygiene and grooming  Attitude - Calm, polite, not guarded Speech - normal volume, prosody, inflection Mood -good Affect -euthymic Thought Process - LLGD Thought Content - No delusional TC expressed SI/HI - Denies  Perceptions - Denies AVH; not RIS Judgement/Insight - Good  Fund of knowledge - WNL Language - No impairments      Lab Results:  No visits with results within 1 Day(s) from this visit.  Latest known visit with results is:  Admission on 01/24/2024, Discharged on 01/24/2024  Component Date Value Ref Range Status   WBC 01/24/2024 10.6 (H)  4.0 - 10.5 K/uL Final   RBC 01/24/2024 4.46  3.87 - 5.11 MIL/uL Final   Hemoglobin 01/24/2024  13.9  12.0 - 15.0 g/dL Final   HCT 88/94/7974 41.4  36.0 - 46.0 % Final   MCV 01/24/2024 92.8  80.0 - 100.0 fL Final   MCH 01/24/2024 31.2  26.0 - 34.0 pg Final   MCHC 01/24/2024 33.6  30.0 - 36.0 g/dL Final   RDW 88/94/7974 12.6  11.5 - 15.5 % Final   Platelets 01/24/2024 285  150 - 400 K/uL Final   nRBC 01/24/2024  0.0  0.0 - 0.2 % Final   Neutrophils Relative % 01/24/2024 58  % Final   Neutro Abs 01/24/2024 6.1  1.7 - 7.7 K/uL Final   Lymphocytes Relative 01/24/2024 34  % Final   Lymphs Abs 01/24/2024 3.6  0.7 - 4.0 K/uL Final   Monocytes Relative 01/24/2024 7  % Final   Monocytes Absolute 01/24/2024 0.8  0.1 - 1.0 K/uL Final   Eosinophils Relative 01/24/2024 1  % Final   Eosinophils Absolute 01/24/2024 0.1  0.0 - 0.5 K/uL Final   Basophils Relative 01/24/2024 0  % Final   Basophils Absolute 01/24/2024 0.0  0.0 - 0.1 K/uL Final   Immature Granulocytes 01/24/2024 0  % Final   Abs Immature Granulocytes 01/24/2024 0.02  0.00 - 0.07 K/uL Final   Sodium 01/24/2024 138  135 - 145 mmol/L Final   Potassium 01/24/2024 4.2  3.5 - 5.1 mmol/L Final   Chloride 01/24/2024 103  98 - 111 mmol/L Final   CO2 01/24/2024 22  22 - 32 mmol/L Final   Glucose, Bld 01/24/2024 79  70 - 99 mg/dL Final   BUN 88/94/7974 10  6 - 20 mg/dL Final   Creatinine, Ser 01/24/2024 0.65  0.44 - 1.00 mg/dL Final   Calcium 88/94/7974 9.7  8.9 - 10.3 mg/dL Final   Total Protein 88/94/7974 7.0  6.5 - 8.1 g/dL Final   Albumin 88/94/7974 4.1  3.5 - 5.0 g/dL Final   AST 88/94/7974 21  15 - 41 U/L Final   ALT 01/24/2024 20  0 - 44 U/L Final   Alkaline Phosphatase 01/24/2024 49  38 - 126 U/L Final   Total Bilirubin 01/24/2024 0.6  0.0 - 1.2 mg/dL Final   GFR, Estimated 01/24/2024 >60  >60 mL/min Final   Anion gap 01/24/2024 13  5 - 15 Final   Magnesium  01/24/2024 1.8  1.7 - 2.4 mg/dL Final   Alcohol, Ethyl (B) 01/24/2024 <15  <15 mg/dL Final   Cholesterol 88/94/7974 127  0 - 200 mg/dL Final   Triglycerides 88/94/7974 113  <150 mg/dL Final   HDL 88/94/7974 45  >40 mg/dL Final   Total CHOL/HDL Ratio 01/24/2024 2.8  RATIO Final   VLDL 01/24/2024 23  0 - 40 mg/dL Final   LDL Cholesterol 01/24/2024 59  0 - 99 mg/dL Final   TSH 88/94/7974 1.097  0.350 - 4.500 uIU/mL Final   Preg Test, Ur 01/24/2024 Negative  Negative Final   POC Amphetamine  UR 01/24/2024 None Detected  NONE DETECTED (Cut Off Level 1000 ng/mL) Final   POC Secobarbital (BAR) 01/24/2024 None Detected  NONE DETECTED (Cut Off Level 300 ng/mL) Final   POC Buprenorphine (BUP) 01/24/2024 None Detected  NONE DETECTED (Cut Off Level 10 ng/mL) Final   POC Oxazepam (BZO) 01/24/2024 None Detected  NONE DETECTED (Cut Off Level 300 ng/mL) Final   POC Cocaine UR 01/24/2024 None Detected  NONE DETECTED (Cut Off Level 300 ng/mL) Final   POC Methamphetamine UR 01/24/2024 None Detected  NONE DETECTED (Cut Off Level 1000 ng/mL)  Final   POC Morphine  01/24/2024 None Detected  NONE DETECTED (Cut Off Level 300 ng/mL) Final   POC Methadone UR 01/24/2024 None Detected  NONE DETECTED (Cut Off Level 300 ng/mL) Final   POC Oxycodone  UR 01/24/2024 None Detected  NONE DETECTED (Cut Off Level 100 ng/mL) Final   POC Marijuana UR 01/24/2024 Positive (A)  NONE DETECTED (Cut Off Level 50 ng/mL) Final   Glucose-Capillary 01/24/2024 97  70 - 99 mg/dL Final     Vitals: Blood pressure 109/66, pulse 75, temperature 98 F (36.7 C), temperature source Oral, resp. rate 20, height 5' 6 (1.676 m), weight 93.3 kg, SpO2 99%.    Lamar Handler Jama Slain, DO

## 2024-01-26 NOTE — BH IP Treatment Plan (Signed)
 Interdisciplinary Treatment and Diagnostic Plan Update  01/26/2024 Time of Session: 10:45 AM Wendy Bridges MRN: [id number]  Principal Diagnosis: Bipolar disorder, unspecified (HCC)  Secondary Diagnoses: Principal Problem:   Bipolar disorder, unspecified (HCC) Active Problems:   PMDD (premenstrual dysphoric disorder)   Generalized anxiety disorder   Current Medications:  Current Facility-Administered Medications  Medication Dose Route Frequency Provider Last Rate Last Admin   acetaminophen  (TYLENOL ) tablet 650 mg  650 mg Oral Q6H PRN Olasunkanmi, Oluwatosin, NP       alum & mag hydroxide-simeth (MAALOX/MYLANTA) 200-200-20 MG/5ML suspension 30 mL  30 mL Oral Q4H PRN Olasunkanmi, Oluwatosin, NP       haloperidol  (HALDOL ) tablet 5 mg  5 mg Oral TID PRN Olasunkanmi, Oluwatosin, NP       And   diphenhydrAMINE (BENADRYL) capsule 50 mg  50 mg Oral TID PRN Olasunkanmi, Oluwatosin, NP       diphenhydrAMINE (BENADRYL) capsule 50 mg  50 mg Oral Q8H PRN Olasunkanmi, Oluwatosin, NP       haloperidol  lactate (HALDOL ) injection 5 mg  5 mg Intramuscular TID PRN Olasunkanmi, Oluwatosin, NP       And   diphenhydrAMINE (BENADRYL) injection 50 mg  50 mg Intramuscular TID PRN Olasunkanmi, Oluwatosin, NP       And   LORazepam (ATIVAN) injection 2 mg  2 mg Intramuscular TID PRN Olasunkanmi, Oluwatosin, NP       haloperidol  lactate (HALDOL ) injection 10 mg  10 mg Intramuscular TID PRN Olasunkanmi, Oluwatosin, NP       And   diphenhydrAMINE (BENADRYL) injection 50 mg  50 mg Intramuscular TID PRN Olasunkanmi, Oluwatosin, NP       And   LORazepam (ATIVAN) injection 2 mg  2 mg Intramuscular TID PRN Olasunkanmi, Oluwatosin, NP       hydrOXYzine (ATARAX) tablet 25 mg  25 mg Oral TID PRN Olasunkanmi, Oluwatosin, NP   25 mg at 01/25/24 1716   magnesium  hydroxide (MILK OF MAGNESIA) suspension 30 mL  30 mL Oral Daily PRN Olasunkanmi, Oluwatosin, NP       nicotine (NICODERM CQ - dosed in mg/24 hours) patch 21 mg  21  mg Transdermal Q0600 Olasunkanmi, Oluwatosin, NP   21 mg at 01/25/24 1031   promethazine  (PHENERGAN ) tablet 25 mg  25 mg Oral Q6H PRN Chandra Charleston Sherlean Ruth, DO   25 mg at 01/25/24 2106   QUEtiapine (SEROQUEL) tablet 75 mg  75 mg Oral Q2000 Chandra Charleston Christian Lee, DO       traZODone (DESYREL) tablet 50 mg  50 mg Oral QHS PRN Olasunkanmi, Oluwatosin, NP       PTA Medications: Medications Prior to Admission  Medication Sig Dispense Refill Last Dose/Taking   acetaminophen  (TYLENOL ) 325 MG tablet Take 650 mg by mouth every 6 (six) hours as needed (For pain).      ibuprofen  (ADVIL ) 200 MG tablet Take 400 mg by mouth every 6 (six) hours as needed (For pain).      OVER THE COUNTER MEDICATION Take 1 capsule by mouth daily as needed (For calming or stress relief). L-Theanine Gummies       Patient Stressors:    Patient Strengths:    Treatment Modalities: Medication Management, Group therapy, Case management,  1 to 1 session with clinician, Psychoeducation, Recreational therapy.   Physician Treatment Plan for Primary Diagnosis: Bipolar disorder, unspecified (HCC) Long Term Goal(s): Improvement in symptoms so as ready for discharge   Short Term Goals: Ability to identify changes in lifestyle  to reduce recurrence of condition will improve Ability to verbalize feelings will improve Ability to disclose and discuss suicidal ideas Ability to demonstrate self-control will improve Ability to identify and develop effective coping behaviors will improve Compliance with prescribed medications will improve  Medication Management: Evaluate patient's response, side effects, and tolerance of medication regimen.  Therapeutic Interventions: 1 to 1 sessions, Unit Group sessions and Medication administration.  Evaluation of Outcomes: Not Progressing  Physician Treatment Plan for Secondary Diagnosis: Principal Problem:   Bipolar disorder, unspecified (HCC) Active Problems:   PMDD (premenstrual  dysphoric disorder)   Generalized anxiety disorder  Long Term Goal(s): Improvement in symptoms so as ready for discharge   Short Term Goals: Ability to identify changes in lifestyle to reduce recurrence of condition will improve Ability to verbalize feelings will improve Ability to disclose and discuss suicidal ideas Ability to demonstrate self-control will improve Ability to identify and develop effective coping behaviors will improve Compliance with prescribed medications will improve     Medication Management: Evaluate patient's response, side effects, and tolerance of medication regimen.  Therapeutic Interventions: 1 to 1 sessions, Unit Group sessions and Medication administration.  Evaluation of Outcomes: Not Progressing   RN Treatment Plan for Primary Diagnosis: Bipolar disorder, unspecified (HCC) Long Term Goal(s): Knowledge of disease and therapeutic regimen to maintain health will improve  Short Term Goals: Ability to remain free from injury will improve, Ability to verbalize frustration and anger appropriately will improve, Ability to demonstrate self-control, Ability to participate in decision making will improve, Ability to verbalize feelings will improve, Ability to disclose and discuss suicidal ideas, Ability to identify and develop effective coping behaviors will improve, and Compliance with prescribed medications will improve  Medication Management: RN will administer medications as ordered by provider, will assess and evaluate patient's response and provide education to patient for prescribed medication. RN will report any adverse and/or side effects to prescribing provider.  Therapeutic Interventions: 1 on 1 counseling sessions, Psychoeducation, Medication administration, Evaluate responses to treatment, Monitor vital signs and CBGs as ordered, Perform/monitor CIWA, COWS, AIMS and Fall Risk screenings as ordered, Perform wound care treatments as ordered.  Evaluation of  Outcomes: Not Progressing   LCSW Treatment Plan for Primary Diagnosis: Bipolar disorder, unspecified (HCC) Long Term Goal(s): Safe transition to appropriate next level of care at discharge, Engage patient in therapeutic group addressing interpersonal concerns.  Short Term Goals: Engage patient in aftercare planning with referrals and resources, Increase social support, Increase ability to appropriately verbalize feelings, Increase emotional regulation, Facilitate acceptance of mental health diagnosis and concerns, Facilitate patient progression through stages of change regarding substance use diagnoses and concerns, Identify triggers associated with mental health/substance abuse issues, and Increase skills for wellness and recovery  Therapeutic Interventions: Assess for all discharge needs, 1 to 1 time with Social worker, Explore available resources and support systems, Assess for adequacy in community support network, Educate family and significant other(s) on suicide prevention, Complete Psychosocial Assessment, Interpersonal group therapy.  Evaluation of Outcomes: Not Progressing   Progress in Treatment: Attending groups: Yes. Participating in groups: Yes. Taking medication as prescribed: Yes. Toleration medication: Yes. Family/Significant other contact made: No, will contact:  Wendy Bridges mother; [phone] Patient understands diagnosis: Yes. Discussing patient identified problems/goals with staff: Yes. Medical problems stabilized or resolved: Yes. Denies suicidal/homicidal ideation: Yes. Issues/concerns per patient self-inventory: No.  New problem(s) identified:  No  New Short Term/Long Term Goal(s):   medication stabilization, elimination of SI thoughts, development of comprehensive mental wellness plan.  Patient Goals:  I want to work on improving my impulse control, managing my spending, developing healthier coping skills, and reducing my racing thoughts.    Discharge  Plan or Barriers:  Patient recently admitted. CSW will continue to follow and assess for appropriate referrals and possible discharge planning.     Reason for Continuation of Hospitalization: Anxiety Medication stabilization Suicidal ideation  Estimated Length of Stay:  5 - 7 days  Last 3 [place] Suicide Severity Risk Score: Flowsheet Row [facility] (Current[facility] Most recent reading at 01/25/2024 12:00 AM ED from 01/24/2024 in [facility] Most recent reading at 01/24/2024  4:41 PM  C-SSRS RISK CATEGORY Low Risk Low Risk    Last PHQ 2/9 Scores:     No data to display          Scribe for Treatment Team: Sahiba Granholm O Brison Fiumara, LCSWA 01/26/2024 5:04 PM

## 2024-01-26 NOTE — Plan of Care (Signed)
   Problem: Education: Goal: Emotional status will improve Outcome: Progressing   Problem: Activity: Goal: Interest or engagement in activities will improve Outcome: Progressing

## 2024-01-26 NOTE — Group Note (Signed)
 Date:  01/26/2024 Time:  3:47 PM  Group Topic/Focus:  Overcoming Stress:   The focus of this group is to define stress and help patients assess their triggers.    Participation Level:  Active  Participation Quality:  Appropriate  Affect:  Appropriate  Cognitive:  Appropriate  Insight: Appropriate  Engagement in Group:  Engaged  Modes of Intervention:  Discussion  Annalee  Melven Stockard 01/26/2024, 3:47 PM

## 2024-01-26 NOTE — Progress Notes (Signed)
   01/26/24 1400  Psych Admission Type (Psych Patients Only)  Admission Status Voluntary  Psychosocial Assessment  Patient Complaints Anxiety  Eye Contact Fair  Facial Expression Other (Comment)  Affect Other (Comment)  Speech Logical/coherent  Interaction Assertive  Motor Activity Other (Comment)  Appearance/Hygiene Unremarkable  Behavior Characteristics Cooperative  Mood Pleasant  Thought Process  Coherency WDL  Content WDL  Delusions None reported or observed  Perception WDL  Hallucination None reported or observed  Judgment Impaired  Confusion None  Danger to Self  Current suicidal ideation? Denies  Danger to Others  Danger to Others None reported or observed

## 2024-01-27 MED ORDER — QUETIAPINE FUMARATE 25 MG PO TABS
75.0000 mg | ORAL_TABLET | Freq: Every day | ORAL | Status: DC
  Administered 2024-01-27: 75 mg via ORAL
  Filled 2024-01-27: qty 3
  Filled 2024-01-27: qty 21

## 2024-01-27 NOTE — Progress Notes (Signed)
 Wendy Bridges [facility] Inpatient Psychiatry Progress Note  Date: 01/27/24 Patient: Wendy Bridges MRN: [id number]  Principal Problem:   Bipolar disorder, unspecified (HCC) Active Problems:   PMDD (premenstrual dysphoric disorder)   Generalized anxiety disorder  Assessment and Plan:  [name] is a 28 yo F, with history of bipolar disorder, mood lability, depression, and anxiety, who presented to ED on 01/24/24 with suicidal ideation. She reports history of depression mood, loss of interest, low energy/fatigue, gradually worsening over that last several weeks. She recently wrote good-bye letters to her family and friends and planned to commit suicide via pill overdose. After speaking to her mother and friend about her thoughts of self-harm, she was encouraged to seek help. She is currently committed to [facility] on a voluntary basis.    Diagnosis of Bipolar disorder is complicated by PMDD and GAD. Symptoms of PMDD are prominent and have worsened over the last 3 years. OBGYN referral would be helpful for Tx of PMDD and PCOS  01/27/2024 - The patient has stabilized in terms of mood, affect and behavior.  No features of mania, severe depression or primary thought disorder.  She is interacting well with staff and patients as well as attending groups.  She has found benefit from group therapy and provided multiple coping strategies that she has learned while here.  I continue to discuss with her her diagnosis of bipolar disorder with PMDD and GAD.  I reiterated the importance of following up with a OB/GYN for PMDD.  Otherwise, tolerating her medication regimen well.  Will make a final dose change to Seroquel 100 mg.  Given the above factors, the patient feels ready and stable for discharge and I agree.  Will discharge on 01/28/2024  # Bipolar disorder, unspecified (HCC) - Increase Seroquel to 100 mg for mood stability, depression - Group therapy  #PMDD -  Increase Seroquel 100 mg - Recommended patient follow-up with OB/GYN for hormone therapy - Group therapy   #GAD - 100 Seroquel 75 mg - Group therapy  Risk Assessment -mild  Discharge Planning Barriers to discharge: None Estimated length of stay: 01/28/2024  Predicted Discharge location: Back with family     Interval History and update:   My assessment today the patient was interacting well with staff during one of the group therapy sessions.  She presents with a good mood and affect is bright.  She reports finding significant benefit from the group therapy as well as finding benefit of Seroquel.  She informs me that her mind has slowed down, does not have the racing thoughts or unstable mood.  She is tolerating therapy well well with no observed or reported side effects.  Continues to deny SI, HI and AVH.  Sleep and appetite are stable.  I discussed with her the diagnosis of PMDD, GAD and bipolar disorder.  She has positive features of a mild bipolar disorder complicated by PMDD and PCOS.  I discussed that primary treatment of PMDD is an SSRI and/or hormone therapy.  My concerns with the SSRIs are conversion to mania or unstable mood.  Thus, I recommended she follow-up with her OB/GYN for treatment of PMDD. Wise, the patient reports doing really well and feels that she is stable for discharge tomorrow.  I agree with the patient and we will plan for discharge back to her family on 01/28/2024.  Review of systems -Denies nausea, vomiting, diarrhea, constipation, chest pain, shortness of breath, EPS   Physical Exam MSK/Neuro - Normal  gait and station    Mental Status Exam Appearance - Casually dressed, appropriate hygiene and grooming  Attitude - Calm, polite, not guarded Speech - normal volume, prosody, inflection Mood -good Affect -bright Thought Process - LLGD Thought Content - No delusional TC expressed SI/HI - Denies  Perceptions - Denies AVH; not RIS Judgement/Insight -  Good  Fund of knowledge - WNL Language - No impairments      Lab Results:  No visits with results within 1 Day(s) from this visit.  Latest known visit with results is:  Admission on 01/24/2024, Discharged on 01/24/2024  Component Date Value Ref Range Status   WBC 01/24/2024 10.6 (H)  4.0 - 10.5 K/uL Final   RBC 01/24/2024 4.46  3.87 - 5.11 MIL/uL Final   Hemoglobin 01/24/2024 13.9  12.0 - 15.0 g/dL Final   HCT 88/94/7974 41.4  36.0 - 46.0 % Final   MCV 01/24/2024 92.8  80.0 - 100.0 fL Final   MCH 01/24/2024 31.2  26.0 - 34.0 pg Final   MCHC 01/24/2024 33.6  30.0 - 36.0 g/dL Final   RDW 88/94/7974 12.6  11.5 - 15.5 % Final   Platelets 01/24/2024 285  150 - 400 K/uL Final   nRBC 01/24/2024 0.0  0.0 - 0.2 % Final   Neutrophils Relative % 01/24/2024 58  % Final   Neutro Abs 01/24/2024 6.1  1.7 - 7.7 K/uL Final   Lymphocytes Relative 01/24/2024 34  % Final   Lymphs Abs 01/24/2024 3.6  0.7 - 4.0 K/uL Final   Monocytes Relative 01/24/2024 7  % Final   Monocytes Absolute 01/24/2024 0.8  0.1 - 1.0 K/uL Final   Eosinophils Relative 01/24/2024 1  % Final   Eosinophils Absolute 01/24/2024 0.1  0.0 - 0.5 K/uL Final   Basophils Relative 01/24/2024 0  % Final   Basophils Absolute 01/24/2024 0.0  0.0 - 0.1 K/uL Final   Immature Granulocytes 01/24/2024 0  % Final   Abs Immature Granulocytes 01/24/2024 0.02  0.00 - 0.07 K/uL Final   Sodium 01/24/2024 138  135 - 145 mmol/L Final   Potassium 01/24/2024 4.2  3.5 - 5.1 mmol/L Final   Chloride 01/24/2024 103  98 - 111 mmol/L Final   CO2 01/24/2024 22  22 - 32 mmol/L Final   Glucose, Bld 01/24/2024 79  70 - 99 mg/dL Final   BUN 88/94/7974 10  6 - 20 mg/dL Final   Creatinine, Ser 01/24/2024 0.65  0.44 - 1.00 mg/dL Final   Calcium 88/94/7974 9.7  8.9 - 10.3 mg/dL Final   Total Protein 88/94/7974 7.0  6.5 - 8.1 g/dL Final   Albumin 88/94/7974 4.1  3.5 - 5.0 g/dL Final   AST 88/94/7974 21  15 - 41 U/L Final   ALT 01/24/2024 20  0 - 44 U/L Final    Alkaline Phosphatase 01/24/2024 49  38 - 126 U/L Final   Total Bilirubin 01/24/2024 0.6  0.0 - 1.2 mg/dL Final   GFR, Estimated 01/24/2024 >60  >60 mL/min Final   Anion gap 01/24/2024 13  5 - 15 Final   Magnesium  01/24/2024 1.8  1.7 - 2.4 mg/dL Final   Alcohol, Ethyl (B) 01/24/2024 <15  <15 mg/dL Final   Cholesterol 88/94/7974 127  0 - 200 mg/dL Final   Triglycerides 88/94/7974 113  <150 mg/dL Final   HDL 88/94/7974 45  >40 mg/dL Final   Total CHOL/HDL Ratio 01/24/2024 2.8  RATIO Final   VLDL 01/24/2024 23  0 - 40 mg/dL Final  LDL Cholesterol 01/24/2024 59  0 - 99 mg/dL Final   TSH 88/94/7974 1.097  0.350 - 4.500 uIU/mL Final   Preg Test, Ur 01/24/2024 Negative  Negative Final   POC Amphetamine UR 01/24/2024 None Detected  NONE DETECTED (Cut Off Level 1000 ng/mL) Final   POC Secobarbital (BAR) 01/24/2024 None Detected  NONE DETECTED (Cut Off Level 300 ng/mL) Final   POC Buprenorphine (BUP) 01/24/2024 None Detected  NONE DETECTED (Cut Off Level 10 ng/mL) Final   POC Oxazepam (BZO) 01/24/2024 None Detected  NONE DETECTED (Cut Off Level 300 ng/mL) Final   POC Cocaine UR 01/24/2024 None Detected  NONE DETECTED (Cut Off Level 300 ng/mL) Final   POC Methamphetamine UR 01/24/2024 None Detected  NONE DETECTED (Cut Off Level 1000 ng/mL) Final   POC Morphine  01/24/2024 None Detected  NONE DETECTED (Cut Off Level 300 ng/mL) Final   POC Methadone UR 01/24/2024 None Detected  NONE DETECTED (Cut Off Level 300 ng/mL) Final   POC Oxycodone  UR 01/24/2024 None Detected  NONE DETECTED (Cut Off Level 100 ng/mL) Final   POC Marijuana UR 01/24/2024 Positive (A)  NONE DETECTED (Cut Off Level 50 ng/mL) Final   Glucose-Capillary 01/24/2024 97  70 - 99 mg/dL Final     Vitals: Blood pressure 120/69, pulse 75, temperature 98 F (36.7 C), temperature source Oral, resp. rate 20, height 5' 6 (1.676 m), weight 93.3 kg, SpO2 98%.    Lamar Handler Jama Slain, DO

## 2024-01-27 NOTE — Progress Notes (Signed)
   01/27/24 1000  Psych Admission Type (Psych Patients Only)  Admission Status Voluntary  Psychosocial Assessment  Patient Complaints None  Eye Contact Fair  Facial Expression Flat  Affect Appropriate to circumstance  Speech Logical/coherent  Interaction Assertive  Motor Activity Slow  Appearance/Hygiene Unremarkable  Behavior Characteristics Appropriate to situation  Mood Pleasant  Thought Process  Coherency WDL  Content WDL  Delusions None reported or observed  Perception WDL  Hallucination None reported or observed  Judgment Impaired  Confusion None  Danger to Self  Current suicidal ideation? Denies  Description of Suicide Plan No Plan  Agreement Not to Harm Self Yes  Description of Agreement vERBAL  Danger to Others  Danger to Others None reported or observed

## 2024-01-27 NOTE — Plan of Care (Signed)
(  Sleep Hours) -7.75 (Any PRNs that were needed, meds refused, or side effects to meds)- none  (Any disturbances and when (visitation, over night)- none reported (Concerns raised by the patient)- No concerns (SI/HI/AVH)- Denies all Problem: Education: Goal: Knowledge of [facility] [place] [facility] Education information/materials will improve Outcome: Progressing Goal: Emotional status will improve Outcome: Progressing Goal: Mental status will improve Outcome: Progressing Goal: Verbalization of understanding the information provided will improve Outcome: Progressing   Problem: Activity: Goal: Interest or engagement in activities will improve Outcome: Progressing Goal: Sleeping patterns will improve Outcome: Progressing   Problem: Coping: Goal: Ability to verbalize frustrations and anger appropriately will improve Outcome: Progressing Goal: Ability to demonstrate self-control will improve Outcome: Progressing   Problem: Health Behavior/Discharge Planning: Goal: Identification of resources available to assist in meeting health care needs will improve Outcome: Progressing Goal: Compliance with treatment plan for underlying cause of condition will improve Outcome: Progressing   Problem: Physical Regulation: Goal: Ability to maintain clinical measurements within normal limits will improve Outcome: Progressing   Problem: Safety: Goal: Periods of time without injury will increase Outcome: Progressing

## 2024-01-27 NOTE — Group Note (Signed)
 Date:  01/27/2024 Time:  9:02 PM  Group Topic/Focus:  Wrap-Up Group:   The focus of this group is to help patients review their daily goal of treatment and discuss progress on daily workbooks.    Participation Level:  Active  Participation Quality:  Appropriate and Attentive  Affect:  Appropriate  Cognitive:  Alert and Appropriate  Insight: Appropriate and Good  Engagement in Group:  Engaged  Modes of Intervention:  Discussion and Education  Additional Comments:  Pt attended and participated in wrap up group this evening and rated their day a 9/10. Pt stated that they have been feeling anxious. Pt goal is D/C and they are looking forward to being D/C tomorrow. Pt has no concerns to relay at this time.   Rosella DELENA Pouch 01/27/2024, 9:02 PM

## 2024-01-27 NOTE — Progress Notes (Signed)
  [facility] Adult Case Management Discharge Plan :  Will you be returning to the same living situation after discharge:  Yes,  patient will be returning home to address on file.  At discharge, do you have transportation home?: Yes,  patient's mother will be providing transportation at 9am.  Do you have the ability to pay for your medications: No. CSW requested samples from pharmacy, request approved.  Release of information consent forms completed and in the chart;  Patient's signature needed at discharge.  Patient to Follow up at:  Follow-up Information     [facility]. Go on 01/30/2024.   Specialty: Professional Counselor Why: Please go to this provider on 01/30/24 at 9:00 am for an assessment, to obtain therapy services. You may also go on Monday through Friday, from 9 am to 1 pm. Contact information: [address]  [address] [place] [address] [phone]         [facility]. Go on 02/06/2024.   Specialty: Behavioral Health Why: Please go to this provider on 02/06/24 at 7:00 am for an assessment, to obtain medication management services. You may also go on Monday through Friday, arrive by 7:00 am for your initial assessment. Contact information: [address] [place]  [id number] [phone]                Next level of care provider has access to [facility] Link:no  Safety Planning and Suicide Prevention discussed: Yes,  completed with patient's mother, Havannah Streat, [phone].     Has patient been referred to the Quitline?: Patient does not use tobacco/nicotine products  Patient has been referred for addiction treatment: No known substance use disorder.  Louetta Lame, LCSWA 01/27/2024, 3:08 PM

## 2024-01-27 NOTE — Group Note (Signed)
 Date:  01/27/2024 Time:  9:55 AM  Group Topic/Focus: Goals group  Patients participated in a goals group focused on setting SMART goals related to recovery. The group began with an psychiatrist using a beach ball with questions to promote engagement and build rapport. Patients then reviewed the components of SMART goals and completed a goals worksheet individually. Participants were encouraged to share their personal SMART goals with the group to promote insight, accountability, and peer support.   Participation Level:  Did Not Attend  Participation Quality:  N/A  Affect:  N/A  Cognitive:  N/A  Insight: None  Engagement in Group:  None  Modes of Intervention:  N/A  Additional Comments:  Wendy Bridges did not attend goals group.  Kristi HERO Libra Gatz 01/27/2024, 9:55 AM

## 2024-01-27 NOTE — Group Note (Signed)
 Date:  01/27/2024 Time:  3:22 PM  Group Topic/Focus: Emotional Education  The group viewed a TED Talk on resilience, highlighting coping with adversity and cultivating personal strengths. The discussion provided a safe space for patients to reflect on past adverse events and explore strategies for managing challenges, focusing on controllable factors, and drawing on personal strengths and support. The group reinforced the concept that resilience is a skill that can be learned and practiced.   Participation Level:  Active  Participation Quality:  Appropriate, Attentive, and Sharing  Affect:  Appropriate  Cognitive:  Alert and Appropriate  Insight: Good and Improving  Engagement in Group:  Engaged and Improving  Modes of Intervention:  Discussion, Education, Exploration, Problem-solving, and Support  Additional Comments:  Haydn attended and actively participated in this wellness group.  Kristi HERO Savaya Hakes 01/27/2024, 3:22 PM

## 2024-01-27 NOTE — Plan of Care (Signed)
 ?  Problem: Education: ?Goal: Mental status will improve ?Outcome: Progressing ?Goal: Verbalization of understanding the information provided will improve ?Outcome: Progressing ?  ?

## 2024-01-27 NOTE — [facility] Suicide Risk Assessment (Signed)
 [facility] INPATIENT:  Family/Significant Other Suicide Prevention Education  Suicide Prevention Education:  Education Completed; Wendy Bridges (mother) [phone], (name of family member/significant other) has been identified by the patient as the family member/significant other with whom the patient will be residing, and identified as the person(s) who will aid the patient in the event of a mental health crisis (suicidal ideations/suicide attempt).  With written consent from the patient, the family member/significant other has been provided the following suicide prevention education, prior to the and/or following the discharge of the patient.  The suicide prevention education provided includes the following: Suicide risk factors Suicide prevention and interventions National Suicide Hotline telephone number [facility] assessment telephone number [facility] Emergency Assistance 911 [place] and/or Residential Mobile Crisis Unit telephone number  Request made of family/significant other to: Remove weapons (e.g., guns, rifles, knives), all items previously/currently identified as safety concern.   Remove drugs/medications (over-the-counter, prescriptions, illicit drugs), all items previously/currently identified as a safety concern.  Wendy Bridges states there are firearms in the home but they are locked away in a safe. Wendy Bridges agrees to the requests made above. CSW explained follow up care for patient and will provide medicaid application for the patient. Wendy Bridges will provide transportation at 9am for patient on expected day of discharge. Wendy Bridges knows who to contact in the event of a mental health crisis.  The family member/significant other verbalizes understanding of the suicide prevention education information provided.  The family member/significant other agrees to remove the items of safety concern listed above.  Wendy Corona  Bridges 01/27/2024, 1:10 PM

## 2024-01-27 NOTE — Group Note (Signed)
 Date:  01/27/2024 Time:  5:38 PM  Group Topic/Focus:  Dimensions of Wellness:   The focus of this group is to introduce the topic of wellness using collage as a creative outlet for expressing emotions, reducing anxiety, fostering group cohesion , and enabling personal insight and healing.    Participation Level:  Active  Participation Quality:  Appropriate and Attentive  Affect:  Appropriate  Cognitive:  Alert and Appropriate  Insight: Appropriate and Good  Engagement in Group:  Engaged and Supportive  Modes of Intervention:  Activity, Discussion, Exploration, Rapport Building, Socialization, and Support    Wendy Bridges 01/27/2024, 5:38 PM

## 2024-01-27 NOTE — Group Note (Signed)
 Date:  01/27/2024 Time:  12:44 AM  Group Topic/Focus:  Alcoholics Anonymous (AA) Meeting    Participation Level:  Did Not Attend  Participation Quality:  N/A  Affect:  N/A  Cognitive:  N/A  Insight: None  Engagement in Group:  N/A  Modes of Intervention:  N/A  Additional Comments:  Patient did not attend AA  [name] 01/27/2024, 12:44 AM

## 2024-01-27 NOTE — [facility] Group Notes (Signed)
 Wendy Bridges did not attend the social work group today 01/27/24 from 1000-1100.

## 2024-01-27 NOTE — Group Note (Signed)
 Date:  01/27/2024 Time:  3:35 PM  Group Topic/Focus: Self confidence and peer support  This group participated in a music therapy session utilizing karaoke to promote self-expression, mood elevation, and social connection. The session provided a supportive and safe environment for patients to engage creatively through singing and listening to music. The activity encouraged self-confidence, peer support, and positive social interaction.    Participation Level:  Active  Participation Quality:  Appropriate, Attentive, Sharing, and Supportive  Affect:  Appropriate  Cognitive:  Alert and Appropriate  Insight: Good and Improving  Engagement in Group:  Engaged, Improving, and Supportive  Modes of Intervention:  Activity, Socialization, and Support  Additional Comments:  Wendy Bridges attended and actively participated in [place].  Kristi HERO Cordarius Benning 01/27/2024, 3:35 PM

## 2024-01-28 MED ORDER — NICOTINE 21 MG/24HR TD PT24: 21.0000 mg | MEDICATED_PATCH | Freq: Every day | TRANSDERMAL | 0 refills | Status: DC

## 2024-01-28 MED ORDER — QUETIAPINE FUMARATE 25 MG PO TABS: 75.0000 mg | ORAL_TABLET | Freq: Every day | ORAL | 0 refills | Status: DC

## 2024-01-28 NOTE — Progress Notes (Signed)
 Pt discharged to lobby- mother present to pick up patient. Pt was stable and appreciative at that time. All papers, samples, and electronic prescriptions were given and valuables returned. Suicide safety plan completed Verbal understanding expressed. Denies SI/HI and A/VH. Pt given opportunity to express concerns and ask questions.
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
## 2024-01-28 NOTE — Progress Notes (Signed)
  [facility] Adult Case Management Discharge Plan :  Will you be returning to the same living situation after discharge:  Yes,  [address] [place]  At discharge, do you have transportation home?: Yes,  Mom will provide transport Do you have the ability to pay for your medications: Yes,  Insurance  Release of information consent forms completed and in the chart;  Patient's signature needed at discharge.  Patient to Follow up at:  Follow-up Information     [facility] Go on 01/30/2024.   Specialty: Professional Counselor Why: Please go to this provider on 01/30/24 at 9:00 am for an assessment, to obtain therapy services. You may also go on Monday through Friday, from 9 am to 1 pm. Contact information: [address]  [address] [place] [address] [phone]         [facility]. Go on 02/06/2024.   Specialty: Behavioral Health Why: Please go to this provider on 02/06/24 at 7:00 am for an assessment, to obtain medication management services. You may also go on Monday through Friday, arrive by 7:00 am for your initial assessment. Contact information: [address] [place]  [phone]                Next level of care provider has access to [facility] Link:yes  Safety Planning and Suicide Prevention discussed: Yes,  Completed with Evelina Lore (mother) [phone] on 01/27/24     Has patient been referred to the Quitline?: Patient refused referral for treatment  Patient has been referred for addiction treatment: Yes, the patient will follow up with an outpatient provider for substance use disorder. Psychiatrist/APP: appointment made and Therapist: appointment made  Roselyn GORMAN Lento, LCSW 01/28/2024, 8:55 AM

## 2024-01-28 NOTE — Progress Notes (Signed)
   01/28/24 0800  Psych Admission Type (Psych Patients Only)  Admission Status Voluntary  Psychosocial Assessment  Patient Complaints None  Eye Contact Fair  Facial Expression Animated  Affect Appropriate to circumstance  Speech Logical/coherent  Interaction Assertive  Motor Activity Slow  Appearance/Hygiene Unremarkable  Behavior Characteristics Cooperative;Appropriate to situation  Mood Pleasant  Thought Process  Coherency WDL  Content WDL  Delusions None reported or observed  Perception WDL  Hallucination None reported or observed  Judgment Impaired  Confusion None  Danger to Self  Current suicidal ideation? Denies  Danger to Others  Danger to Others None reported or observed
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
## 2024-02-12 ENCOUNTER — Ambulatory Visit (INDEPENDENT_AMBULATORY_CARE_PROVIDER_SITE_OTHER): Admitting: Psychiatry

## 2024-02-12 ENCOUNTER — Encounter (HOSPITAL_COMMUNITY): Payer: Self-pay | Admitting: Psychiatry

## 2024-02-12 DIAGNOSIS — F17200 Nicotine dependence, unspecified, uncomplicated: Secondary | ICD-10-CM | POA: Diagnosis not present

## 2024-02-12 DIAGNOSIS — F3132 Bipolar disorder, current episode depressed, moderate: Secondary | ICD-10-CM

## 2024-02-12 DIAGNOSIS — F3281 Premenstrual dysphoric disorder: Secondary | ICD-10-CM

## 2024-02-12 MED ORDER — NICOTINE 21 MG/24HR TD PT24: 21.0000 mg | MEDICATED_PATCH | Freq: Every day | TRANSDERMAL | 3 refills | Status: DC

## 2024-02-12 MED ORDER — QUETIAPINE FUMARATE 25 MG PO TABS: 75.0000 mg | ORAL_TABLET | Freq: Every day | ORAL | 3 refills | Status: DC

## 2024-02-12 NOTE — Progress Notes (Signed)
 Psychiatric Initial Adult Assessment   Virtual Visit via Video Note  I connected with Wendy Bridges on 02/12/24 at  9:30 AM EST by a video enabled telemedicine application and verified that I am speaking with the correct person using two identifiers.  Location: Patient: Car Provider: Home office   I discussed the limitations of evaluation and management by telemedicine and the availability of in person appointments. The patient expressed understanding and agreed to proceed.  I provided 45 minutes of non-face-to-face time during this encounter.    Patient Identification: Wendy Bridges MRN:  [id number] Date of Evaluation:  02/12/2024 Referral Source: [facility] Chief Complaint:  I have been feeling good Visit Diagnosis:    ICD-10-CM   1. PMDD (premenstrual dysphoric disorder)  F32.81 QUEtiapine  (SEROQUEL ) 25 MG tablet    2. Bipolar affective disorder, currently depressed, moderate (HCC)  F31.32 QUEtiapine  (SEROQUEL ) 25 MG tablet    3. Tobacco use disorder  F17.200 nicotine  (NICODERM CQ  - DOSED IN MG/24 HOURS) 21 mg/24hr patch      History of Present Illness: 28 year old female seen today for initial psychiatric evaluation.  She was referred to outpatient psychiatry by [facility] where she presented on 01/24/2024-01/28/2024.  Per chart review patient was suicidal with a plan to overdose on pills.  She was also found to have bipolar disorder.  She has a psychiatric history of anxiety, depression, tobacco dependence, bipolar disorder, SI, PMDD.  Currently she is managed on Seroquel  25 mg nightly and Nicorette CQ 21 mg patches.  She reports her medications are effective in managing her psychiatric conditions.  Today she is well-groomed, pleasant, cooperative, and engaged in conversation.  She informed clinical research associate that since her hospitalization she has been feeling really good.  Prior to her hospitalization she described being irritable, distracted, having racing thoughts, impulsive spending, and unsafe  sexual practices.  She now notes that she does not engage in these activities.  Patient finds Seroquel  effective and notes that her anxiety and depression are better managed.  She notes that she now feels in control and does not have feelings of hopelessness.  Today provider conducted a GAD-7 and patient scored a 7.  Provider also conducted PHQ-9 to be scored a 5.  She endorsed adequate sleep and appetite.  Today she denies SI/HI/AVH, mania, paranoia.  Overall patient notes that she is doing well.  No medication changes made today.  Patient agreeable to continue medications as prescribed. Associated Signs/Symptoms: Depression Symptoms:  fatigue, difficulty concentrating, anxiety, increased appetite, (Hypo) Manic Symptoms:  Reports mood stable since hospitalization Anxiety Symptoms:  Denies Psychotic Symptoms:  Denies PTSD Symptoms: NA  Past Psychiatric History: Anxiety, depression, bipolar disorder, SI, tobacco dependence, and PMDD  Previous Psychotropic Medications: Lexapro, Prozac, Wellbutrin, Abilify  Substance Abuse History in the last 12 months:  Yes.    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    Past Surgical History:  Procedure Laterality Date   ANTERIOR CRUCIATE LIGAMENT REPAIR Left 11/20/2012   Procedure: left knee arthroscopy with anterior cruciate ligamnet reconstruction autograph partial lateral menisectomy;  Surgeon: Maude KANDICE Herald, MD;  [facility];  Service: Orthopedics;  Laterality: Left;    Family Psychiatric History: Mother anxiety and depression, maternal uncle bipolar disorder  Family History:  Family History  Problem Relation Age of  Onset   Anesthesia problems Mother        post-op N/V   Anesthesia problems Maternal Grandmother        post-op N/V   Diabetes  Paternal Grandmother     Social History:   Social History   Socioeconomic History   Marital status: Single    Spouse name: Not on file   Number of children: Not on file   Years of education: Not on file   Highest education level: Not on file  Occupational History   Not on file  Tobacco Use   Smoking status: Never   Smokeless tobacco: Never  Substance and Sexual Activity   Alcohol use: No   Drug use: Yes    Types: Marijuana   Sexual activity: Never    Birth control/protection: Pill  Other Topics Concern   Not on file  Social History Narrative   Not on file   Social Drivers of Health   Financial Resource Strain: Not on file  Food Insecurity: No Food Insecurity (01/25/2024)   Hunger Vital Sign    Worried About Running Out of Food in the Last Year: Never true    Ran Out of Food in the Last Year: Never true  Transportation Needs: No Transportation Needs (01/25/2024)   PRAPARE - Administrator, Civil Service (Medical): No    Lack of Transportation (Non-Medical): No  Physical Activity: Not on file  Stress: Not on file  Social Connections: Not on file    Additional Social History: Patient resides in [place]. She is single and has no children. She works at [employer] at [employer]. She endorses smoking marijuana. She also notes that she smokes two packs of cigarettes weekly. She notes that she drinks alcohol socially.  Allergies:  No Known Allergies  Metabolic Disorder Labs: No results found for: HGBA1C, MPG No results found for: PROLACTIN Lab Results  Component Value Date   CHOL 127 01/24/2024   TRIG 113 01/24/2024   HDL 45 01/24/2024   CHOLHDL 2.8 01/24/2024   VLDL 23 01/24/2024   LDLCALC 59 01/24/2024   Lab Results  Component Value Date   TSH 1.097 01/24/2024    Therapeutic Level Labs: No results found for: LITHIUM No results found for: CBMZ No results found for: VALPROATE  Current Medications: Current Outpatient Medications   Medication Sig Dispense Refill   nicotine  (NICODERM CQ  - DOSED IN MG/24 HOURS) 21 mg/24hr patch Place 1 patch (21 mg total) onto the skin daily at 6 (six) AM. 30 patch 3   QUEtiapine  (SEROQUEL ) 25 MG tablet Take 3 tablets (75 mg total) by mouth at bedtime. 90 tablet 3   No current facility-administered medications for this visit.    Musculoskeletal: Strength & Muscle Tone: within normal limits and telehealth visit Gait & Station: normal, telehealth visit Patient leans: N/A  Psychiatric Specialty Exam: Review of Systems  There were no vitals taken for this visit.There is no height or weight on file to calculate BMI.  General Appearance: Well Groomed  Eye Contact:  Good  Speech:  Clear and Coherent and Normal Rate  Volume:  Normal  Mood:  Euthymic  Affect:  Appropriate and Congruent  Thought Process:  Coherent, Goal Directed, and Linear  Orientation:  Full (Time, Place, and Person)  Thought Content:  WDL and Logical  Suicidal Thoughts:  No  Homicidal Thoughts:  No  Memory:  Immediate;   Good Recent;   Good Remote;   Good  Judgement:  Good  Insight:  Good  Psychomotor Activity:  Normal  Concentration:  Concentration: Good and Attention Span: Good  Recall:  Good  Fund of Knowledge:Good  Language: Good  Akathisia:  No  Handed:  Right  AIMS (if indicated):  not done  Assets:  Communication Skills Desire for Improvement Financial Resources/Insurance Housing Leisure Time Physical Health Social Support Transportation  ADL's:  Intact  Cognition: WNL  Sleep:  Good   Screenings: AUDIT    Flowsheet Row [facility]  Alcohol Use Disorder Identification Test Final Score (AUDIT) 1   GAD-7    Flowsheet Row Office Visit from 02/12/2024 in [facility]  Total GAD-7 Score 7   PHQ2-9    Flowsheet Row Office Visit from 02/12/2024 in [facility]   PHQ-2 Total Score 0  PHQ-9 Total Score 5   Flowsheet Row Ad[facility] ([facility] Most recent reading at 01/25/2024 12:00 AM ED from 01/24/2024 in [facility] Most recent reading at 01/24/2024  4:41 PM  C-SSRS RISK CATEGORY Low Risk Low Risk    Assessment and Plan: Patient notes that she is doing well on her current medication regimen.  No medication changes made today.  Patient agreeable to continue medications as prescribed.  Patient will follow-up with OB/GYN to discuss PMDD  1. PMDD (premenstrual dysphoric disorder) (Primary)  Continue- QUEtiapine  (SEROQUEL ) 25 MG tablet; Take 3 tablets (75 mg total) by mouth at bedtime.  Dispense: 90 tablet; Refill: 3  2. Bipolar affective disorder, currently depressed, moderate (HCC)  Continue- QUEtiapine  (SEROQUEL ) 25 MG tablet; Take 3 tablets (75 mg total) by mouth at bedtime.  Dispense: 90 tablet; Refill: 3  3. Tobacco use disorder  Continue- nicotine  (NICODERM CQ  - DOSED IN MG/24 HOURS) 21 mg/24hr patch; Place 1 patch (21 mg total) onto the skin daily at 6 (six) AM.  Dispense: 30 patch; Refill: 3   Collaboration of Care: Other provider involved in patient's care AEB PCP  Patient/Guardian was advised Release of Information must be obtained prior to any record release in order to collaborate their care with an outside provider. Patient/Guardian was advised if they have not already done so to contact the registration department to sign all necessary forms in order for us  to release information regarding their care.   Consent: Patient/Guardian gives verbal consent for treatment and assignment of benefits for services provided during this visit. Patient/Guardian expressed understanding and agreed to proceed.   Zane FORBES Bach, NP 11/24/20259:54 AM

## 2024-04-24 ENCOUNTER — Telehealth (INDEPENDENT_AMBULATORY_CARE_PROVIDER_SITE_OTHER): Admitting: Psychiatry

## 2024-04-24 ENCOUNTER — Encounter (HOSPITAL_COMMUNITY): Payer: Self-pay | Admitting: Psychiatry

## 2024-04-24 DIAGNOSIS — F3281 Premenstrual dysphoric disorder: Secondary | ICD-10-CM | POA: Diagnosis not present

## 2024-04-24 DIAGNOSIS — F17200 Nicotine dependence, unspecified, uncomplicated: Secondary | ICD-10-CM

## 2024-04-24 DIAGNOSIS — F3132 Bipolar disorder, current episode depressed, moderate: Secondary | ICD-10-CM

## 2024-04-24 MED ORDER — QUETIAPINE FUMARATE 25 MG PO TABS: 75.0000 mg | ORAL_TABLET | Freq: Every day | ORAL | 3 refills | Status: AC

## 2024-04-24 MED ORDER — NICOTINE 21 MG/24HR TD PT24: 21.0000 mg | MEDICATED_PATCH | Freq: Every day | TRANSDERMAL | 3 refills | Status: AC

## 2024-04-24 NOTE — Progress Notes (Signed)
 [facility] MD/PA/NP OP Progress Note Virtual Visit via Video Note  I connected with Wendy Bridges on 04/24/24 at 10:00 AM EST by a video enabled telemedicine application and verified that I am speaking with the correct person using two identifiers.  Location: Patient: Home Provider: Clinic   I discussed the limitations of evaluation and management by telemedicine and the availability of in person appointments. The patient expressed understanding and agreed to proceed.  I provided 30 minutes of non-face-to-face time during this encounter.    04/24/2024 10:16 AM Wendy Bridges  MRN:  [id number]  Chief Complaint: I can't focus  HPI: 29 year old female seen today for follow-up psychiatric evaluation.   She has a psychiatric history of anxiety, depression, tobacco dependence, bipolar disorder, SI, PMDD.  Currently she is managed on Seroquel  25 mg nightly and Nicorette CQ 21 mg patches.  She reports her medications are effective in managing her psychiatric conditions.   Today she is well-groomed, pleasant, cooperative, and engaged in conversation.  She informed clinical research associate that she has been having issues concentrating.  She describes being forgetful, disorganized, and inattentive to mentally taxing task.  Patient notes that she has always suffered with inattentiveness.  She reports that she would like to be assessed for ADHD.  Provider informed patient that she will [facility]  She endorsed understanding and agreed.    Since her last visit she notes that her anxiety and depression are well-managed.  She inform her that she has been staying busy working at [employer].  She continues to enjoy her job.   Today provider conducted a GAD-7 and patient scored a 4, at her last visit she scored a 7.  Provider also conducted PHQ-9 to be scored a 3, at her last visit she scored a 5.  She endorsed adequate sleep and appetite.  Today she denies SI/HI/AVH, mania, paranoia.   No medication changes  made today.  Patient agreeable to continue medication as prescribed.  [facility] referred to [facility].  Provider informed patient that in the future Strattera, Intuniv, or Particia could be trialed if attentiveness does not improve.  She endorsed understanding and agreed. Visit Diagnosis:    ICD-10-CM   1. Tobacco use disorder  F17.200 nicotine  (NICODERM CQ  - DOSED IN MG/24 HOURS) 21 mg/24hr patch    2. PMDD (premenstrual dysphoric disorder)  F32.81 QUEtiapine  (SEROQUEL ) 25 MG tablet    3. 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    Past Surgical History:  Procedure Laterality Date   ANTERIOR CRUCIATE LIGAMENT REPAIR Left 11/20/2012   Procedure: left knee arthroscopy with anterior cruciate ligamnet reconstruction autograph partial lateral menisectomy;  Surgeon: Maude KANDICE Herald, MD;  Location: [facility];  Service: Orthopedics;  Laterality: Left;    Family Psychiatric History:  Mother anxiety and depression, maternal uncle bipolar disorder   Family History:  Family History  Problem Relation Age of Onset   Anesthesia problems Mother        post-op N/V   Anesthesia problems Maternal Grandmother        post-op N/V   Diabetes Paternal Grandmother     Social History:  Social History   Socioeconomic History   Marital status: Single    Spouse name: Not on file   Number of children: Not on file   Years of education: Not on file   Highest education level: Not on file  Occupational History   Not on file  Tobacco Use   Smoking status: Never   Smokeless tobacco: Never  Substance and Sexual Activity   Alcohol use: No   Drug  use: Yes    Types: Marijuana   Sexual activity: Never    Birth control/protection: Pill  Other Topics Concern   Not on file  Social History Narrative   Not on file   Social Drivers of Health   Tobacco Use: Low Risk (02/12/2024)   Patient History    Smoking Tobacco Use: Never    Smokeless Tobacco Use: Never    Passive Exposure: Not on file  Financial Resource Strain: Not on file  Food Insecurity: No Food Insecurity (01/25/2024)   Epic    Worried About Programme Researcher, Broadcasting/film/video in the Last Year: Never true    Ran Out of Food in the Last Year: Never true  Transportation Needs: No Transportation Needs (01/25/2024)   Epic    Lack of Transportation (Medical): No    Lack of Transportation (Non-Medical): No  Physical Activity: Not on file  Stress: Not on file  Social Connections: Not on file  Depression (PHQ2-9): Low Risk (04/24/2024)   Depression (PHQ2-9)    PHQ-2 Score: 3  Recent Concern: Depression (PHQ2-9) - Medium Risk (02/12/2024)   Depression (PHQ2-9)    PHQ-2 Score: 5  Alcohol Screen: Low Risk (01/24/2024)   Alcohol Screen    Last Alcohol Screening Score (AUDIT): 1  Housing: Unknown (01/25/2024)   Epic    Unable to Pay for Housing in the Last Year: No    Number of Times Moved in the Last Year: Not on file    Homeless in the Last Year: No  Utilities: Not At Risk (01/25/2024)   Epic    Threatened with loss of utilities: No  Health Literacy: Not on file    Allergies: Allergies[1]  Metabolic Disorder Labs: No results found for: HGBA1C, MPG No results found for: PROLACTIN Lab Results  Component Value Date   CHOL 127 01/24/2024   TRIG 113 01/24/2024   HDL 45 01/24/2024   CHOLHDL 2.8 01/24/2024   VLDL 23 01/24/2024   LDLCALC 59 01/24/2024   Lab Results  Component Value Date   TSH 1.097 01/24/2024    Therapeutic Level Labs: No results found for: LITHIUM No results found for: VALPROATE No results found for: CBMZ  Current Medications: Current Outpatient  Medications  Medication Sig Dispense Refill   nicotine  (NICODERM CQ  - DOSED IN MG/24 HOURS) 21 mg/24hr patch Place 1 patch (21 mg total) onto the skin daily at 6 (six) AM. 30 patch 3   QUEtiapine  (SEROQUEL ) 25 MG tablet Take 3 tablets (75 mg total) by mouth at bedtime. 90 tablet 3   No current facility-administered medications for this visit.     Musculoskeletal: Strength & Muscle Tone: within normal limits and Telehealth visit Gait & Station: normal, Telehealth visit Patient leans: N/A  Psychiatric Specialty Exam: Review of Systems  There were no vitals taken for this visit.There is no height or weight on file to calculate BMI.  General Appearance: Well Groomed  Eye Contact:  Good  Speech:  Clear and Coherent and Normal Rate  Volume:  Normal  Mood:  Euthymic  Affect:  Appropriate and Congruent  Thought Process:  Coherent, Goal Directed, and Linear  Orientation:  Full (Time, Place, and Person)  Thought Content: WDL and Logical   Suicidal Thoughts:  No  Homicidal Thoughts:  No  Memory:  Immediate;   Good Recent;   Good Remote;   Good  Judgement:  Good  Insight:  Good  Psychomotor Activity:  Normal  Concentration:  Concentration: Good and Attention Span: Good  Recall:  Good  Fund of Knowledge: Good  Language: Good  Akathisia:  No  Handed:  Right  AIMS (if indicated): not done  Assets:  Communication Skills Desire for Improvement Financial Resources/Insurance Housing Leisure Time Physical Health Social Support Talents/Skills Transportation Vocational/Educational  ADL's:  Intact  Cognition: WNL  Sleep:  Good   Screenings: AUDIT    Flowsheet Row [facility]  Alcohol Use Disorder Identification Test Final Score (AUDIT) 1   GAD-7    Flowsheet Row Video Visit from 04/24/2024 in [facility] Office Visit from 02/12/2024 in [facility]   Total GAD-7 Score 4 7   PHQ2-9    Flowsheet Row Video Visit from 04/24/2024 in [facility] Office Visit from 02/12/2024 in [facility]  PHQ-2 Total Score 1 0  PHQ-9 Total Score 3 5   Flowsheet Row Ad[facility] (Discharged[facility] Most recent reading at 01/25/2024 12:00 AM ED from 01/24/2024 in [facility] Most recent reading at 01/24/2024  4:41 PM  C-SSRS RISK CATEGORY Low Risk Low Risk     Assessment and Plan: Patient reports that at times she has poor concentration.  She would like to be tested for ADHD.  Provider informed patient that she will be [facility] to [facility].  No medication changes made today.  Patient agreeable to continue medication as prescribed.  Provider informed patient that in the future Strattera, Intuniv, or Particia could be trialed if attentiveness does not improve.  She endorsed understanding and agreed.   1. Tobacco use disorder  Continue- nicotine  (NICODERM CQ  - DOSED IN MG/24 HOURS) 21 mg/24hr patch; Place 1 patch (21 mg total) onto the skin daily at 6 (six) AM.  Dispense: 30 patch; Refill: 3  2. PMDD (premenstrual dysphoric disorder)  Continue- QUEtiapine  (SEROQUEL ) 25 MG tablet; Take 3 tablets (75 mg total) by mouth at bedtime.  Dispense: 90 tablet; Refill: 3  3. Bipolar affective disorder, currently depressed, moderate (HCC)  Continue- QUEtiapine  (SEROQUEL ) 25 MG tablet; Take 3 tablets (75 mg total) by mouth at bedtime.  Dispense: 90 tablet; Refill: 3   Collaboration of Care: Collaboration of Care: Other provider involved in patient's care AEB PCP  Patient/Guardian was advised Release of Information must be obtained prior to any record release in order to collaborate their care with an outside provider. Patient/Guardian was advised if they have not already done so to contact the registration  department to sign all necessary forms in order for us  to release information regarding their care.   Consent: Patient/Guardian gives verbal consent for treatment and assignment of benefits for services provided during this visit. Patient/Guardian expressed understanding and agreed to proceed.   Follow-up in 2 months Zane FORBES Bach, NP 04/24/2024, 10:16 AM     [1] No Known Allergies

## 2024-06-27 ENCOUNTER — Telehealth (HOSPITAL_COMMUNITY): Admitting: Psychiatry
